# Patient Record
Sex: Female | Born: 1970 | Race: Black or African American | Hispanic: No | Marital: Married | Smoking: Never smoker
Health system: Southern US, Community
[De-identification: ages and names within clinical notes are randomized; demographics above are authoritative.]

## PROBLEM LIST (undated history)

## (undated) HISTORY — PX: BREAST BIOPSY: SHX20

---

## 1988-09-13 HISTORY — PX: OVARIAN CYST SURGERY: SHX726

## 2007-05-20 ENCOUNTER — Emergency Department: Payer: Self-pay | Admitting: Unknown Physician Specialty

## 2009-10-04 ENCOUNTER — Emergency Department: Payer: Self-pay | Admitting: Internal Medicine

## 2009-10-21 ENCOUNTER — Emergency Department: Payer: Self-pay

## 2010-10-14 ENCOUNTER — Ambulatory Visit: Payer: Self-pay

## 2011-03-19 ENCOUNTER — Emergency Department: Payer: Self-pay | Admitting: Emergency Medicine

## 2011-05-08 ENCOUNTER — Emergency Department: Payer: Self-pay | Admitting: Emergency Medicine

## 2011-07-15 ENCOUNTER — Ambulatory Visit: Payer: Self-pay | Admitting: Family Medicine

## 2011-11-06 ENCOUNTER — Emergency Department: Payer: Self-pay | Admitting: Emergency Medicine

## 2012-09-27 ENCOUNTER — Ambulatory Visit: Payer: Self-pay

## 2012-09-28 ENCOUNTER — Ambulatory Visit: Payer: Self-pay

## 2012-12-14 ENCOUNTER — Ambulatory Visit: Payer: Self-pay | Admitting: General Surgery

## 2013-01-10 ENCOUNTER — Encounter: Payer: Self-pay | Admitting: *Deleted

## 2013-07-26 ENCOUNTER — Encounter: Payer: Self-pay | Admitting: General Surgery

## 2013-07-26 ENCOUNTER — Ambulatory Visit (INDEPENDENT_AMBULATORY_CARE_PROVIDER_SITE_OTHER): Payer: BC Managed Care – PPO | Admitting: General Surgery

## 2013-07-26 ENCOUNTER — Other Ambulatory Visit: Payer: BC Managed Care – PPO

## 2013-07-26 VITALS — BP 120/78 | HR 64 | Resp 12 | Ht 59.0 in | Wt 146.0 lb

## 2013-07-26 DIAGNOSIS — N6009 Solitary cyst of unspecified breast: Secondary | ICD-10-CM

## 2013-07-26 DIAGNOSIS — N6001 Solitary cyst of right breast: Secondary | ICD-10-CM

## 2013-07-26 DIAGNOSIS — N63 Unspecified lump in unspecified breast: Secondary | ICD-10-CM

## 2013-07-26 HISTORY — PX: BREAST CYST ASPIRATION: SHX578

## 2013-07-26 NOTE — Patient Instructions (Signed)
Continue self breast exams. Call office for any new breast issues or concerns. 

## 2013-07-26 NOTE — Progress Notes (Signed)
Patient ID: Maria Jacobs, female   DOB: Mar 10, 1971, 42 y.o.   MRN: [id number]  Chief Complaint  Patient presents with  . Follow-up    mammogram    HPI Maria Jacobs is a 42 y.o. female who presents for a breast evaluation. The most recent mammogram was done on 09-27-12. States she can feel a lump in the right breast since her last mammogram.  Denies pain or breast symptoms.  States that Dr Hillery Aldo feels like it has gotten larger and she feels like it gets larger around her monthly cycle. No breast trauma or injury. Patient does perform regular self breast checks and gets regular mammograms done.  No family history of breast cancer.  HPI  No past medical history on file.  Past Surgical History  Procedure Laterality Date  . Ovarian cyst surgery  1990    Family History  Problem Relation Age of Onset  . Adopted: Yes  . Asthma Mother     Social History History  Substance Use Topics  . Smoking status: Never Smoker   . Smokeless tobacco: Never Used  . Alcohol Use: No    No Known Allergies  Current Outpatient Prescriptions  Medication Sig Dispense Refill  . ibuprofen (ADVIL,MOTRIN) 800 MG tablet Take 800 mg by mouth every 8 (eight) hours as needed.       No current facility-administered medications for this visit.    Review of Systems Review of Systems  Constitutional: Negative.   Respiratory: Negative.   Cardiovascular: Negative.     Blood pressure 120/78, pulse 64, resp. rate 12, height 4\' 11"  (1.499 m), weight 146 lb (66.225 kg), last menstrual period 07/14/2013.  Physical Exam Physical Exam  Constitutional: She is oriented to person, place, and time. She appears well-developed and well-nourished.  Neck: Neck supple.  Cardiovascular: Normal rate, regular rhythm and normal heart sounds.   Pulmonary/Chest: Effort normal and breath sounds normal. Right breast exhibits mass (2 cm smooth movable nodule at 10 o'clock). Right breast exhibits no inverted nipple, no nipple  discharge, no skin change and no tenderness. Left breast exhibits no inverted nipple, no mass, no nipple discharge, no skin change and no tenderness.  Lymphadenopathy:    She has no cervical adenopathy.    She has no axillary adenopathy.  Neurological: She is alert and oriented to person, place, and time.  Skin: Skin is warm and dry.    Data Reviewed Right breast ultrasound dated September 28, 2012 showed a 2.5 cm simple cyst in the 9:00 position. BI-RAD-2.  Mammogram dated September 27, 2012 showed extremely dense breast consistent with age. Rounded mass in the lateral aspect. BI-RAD-2.  PCP note reviewed.  Ultrasound examination of the right breast at the 10:00 position showed a 1.1 x 1.4 x 1.5 cm simple cyst with a fairly thick rim. This measured up to 0.2 cm in thickness. This is significantly changed from her January 2014 exam for a thin rim was identified. This is suggestive of chronic inflammation.  The patient was desirous of aspiration due to local discomfort. 1 cc of 1% plain Xylocaine was utilized a well-tolerated. The ureter was aspirated and relatively thin pale green fluid returned through this was discarded. The cystic component completely collapsed. The residual cyst wall was still evident measuring up to 0.6 x 1.1 cm in aggregate.  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      Plan    The patient was advised of  the cyst may recur, and if necessary vacuum excision of the cyst wall the undertaken. We will plan for follow up exam in 6 weeks.         Maria Jacobs 07/27/2013, 6:13 AM

## 2013-07-27 DIAGNOSIS — N63 Unspecified lump in unspecified breast: Secondary | ICD-10-CM | POA: Insufficient documentation

## 2013-07-27 DIAGNOSIS — N6009 Solitary cyst of unspecified breast: Secondary | ICD-10-CM | POA: Insufficient documentation

## 2013-08-07 ENCOUNTER — Encounter: Payer: Self-pay | Admitting: General Surgery

## 2013-09-10 ENCOUNTER — Ambulatory Visit: Payer: BC Managed Care – PPO | Admitting: General Surgery

## 2013-09-12 ENCOUNTER — Ambulatory Visit (INDEPENDENT_AMBULATORY_CARE_PROVIDER_SITE_OTHER): Payer: BC Managed Care – PPO | Admitting: General Surgery

## 2013-09-12 ENCOUNTER — Other Ambulatory Visit: Payer: BC Managed Care – PPO

## 2013-09-12 ENCOUNTER — Encounter: Payer: Self-pay | Admitting: General Surgery

## 2013-09-12 VITALS — BP 140/76 | HR 74 | Resp 12 | Ht 59.0 in | Wt 147.0 lb

## 2013-09-12 DIAGNOSIS — N6009 Solitary cyst of unspecified breast: Secondary | ICD-10-CM

## 2013-09-12 NOTE — Progress Notes (Signed)
Patient ID: Maria Jacobs, female   DOB: [birth year], 42 y.o.   MRN: [id number]  Chief Complaint  Patient presents with  . Follow-up    HPI Maria Jacobs is a 42 y.o. female.  who presents for her 6 week follow up breast evaluation. Patient does perform regular self breast checks. The patient has noted slight fullness in the upper outer quadrant of the right breast over the last 2 weeks, but has not experienced recurrent pain as on her last visit.  HPI  No past medical history on file.  Past Surgical History  Procedure Laterality Date  . Ovarian cyst surgery  1990  . Breast cyst aspiration Right 07-26-13    Family History  Problem Relation Age of Onset  . Adopted: Yes  . Asthma Mother     Social History History  Substance Use Topics  . Smoking status: Never Smoker   . Smokeless tobacco: Never Used  . Alcohol Use: No    No Known Allergies  Current Outpatient Prescriptions  Medication Sig Dispense Refill  . ibuprofen (ADVIL,MOTRIN) 800 MG tablet Take 800 mg by mouth every 8 (eight) hours as needed.       No current facility-administered medications for this visit.    Review of Systems Review of Systems  Constitutional: Negative.   Respiratory: Negative.   Cardiovascular: Negative.     Blood pressure 140/76, pulse 74, resp. rate 12, height 4\' 11"  (1.499 m), weight 147 lb (66.679 kg), last menstrual period 08/13/2013.  Physical Exam Physical Exam  Constitutional: She is oriented to person, place, and time. She appears well-developed and well-nourished.  Neck: Neck supple.  Cardiovascular: Normal rate, regular rhythm and normal heart sounds.   Pulmonary/Chest: Effort normal and breath sounds normal. Right breast exhibits no inverted nipple, no mass, no nipple discharge, no skin change and no tenderness.    Right breast > left breast  Lymphadenopathy:    She has no cervical adenopathy.    She has no axillary adenopathy.  Neurological: She is alert and oriented to  person, place, and time.  Skin: Skin is warm and dry.    Data Reviewed Ultrasound examination of the upper outer quadrant right breast showed a recurrent cyst measuring 1.3 x 1.7 x 1.9 cm. The thickened wall appreciated on her last visit is much less evident. The area was aspirated with return of less than 3 cc of turbid fluid with complete collapse. This fluid was sent for cytology due to the cyst recurrence and the thickened wall previously evident. The cavity was then treated with air insufflation. The procedure was well-tolerated with the use of 1 cc of 1% plain Xylocaine.  Assessment    Recurrent right breast cyst status post re-aspiration and air insufflation.     Plan    The patient will be contacted when her cytology report is available.        Earline Mayotte 09/14/2013, 3:30 PM

## 2013-09-12 NOTE — Patient Instructions (Signed)
Continue self breast exams. Call office for any new breast issues or concerns. 

## 2013-09-17 LAB — FINE-NEEDLE ASPIRATION

## 2014-01-01 ENCOUNTER — Ambulatory Visit: Payer: Self-pay | Admitting: Family Medicine

## 2014-03-04 ENCOUNTER — Ambulatory Visit: Payer: BC Managed Care – PPO | Admitting: General Surgery

## 2014-03-25 ENCOUNTER — Ambulatory Visit: Payer: BC Managed Care – PPO | Admitting: General Surgery

## 2014-03-28 ENCOUNTER — Encounter: Payer: Self-pay | Admitting: *Deleted

## 2014-07-15 ENCOUNTER — Encounter: Payer: Self-pay | Admitting: General Surgery

## 2014-12-05 ENCOUNTER — Emergency Department: Payer: Self-pay | Admitting: Emergency Medicine

## 2014-12-05 LAB — COMPREHENSIVE METABOLIC PANEL
AST: 23 U/L
Albumin: 4.2 g/dL
Alkaline Phosphatase: 59 U/L
Anion Gap: 9 (ref 7–16)
BILIRUBIN TOTAL: 0.3 mg/dL
BUN: 14 mg/dL
CHLORIDE: 106 mmol/L
Calcium, Total: 8.9 mg/dL
Co2: 25 mmol/L
Creatinine: 0.79 mg/dL
GLUCOSE: 131 mg/dL — AB
Potassium: 3.6 mmol/L
SGPT (ALT): 22 U/L
SODIUM: 140 mmol/L
TOTAL PROTEIN: 7.6 g/dL

## 2014-12-05 LAB — CBC
HCT: 36.8 % (ref 35.0–47.0)
HGB: 11.8 g/dL — ABNORMAL LOW (ref 12.0–16.0)
MCH: 30 pg (ref 26.0–34.0)
MCHC: 32.1 g/dL (ref 32.0–36.0)
MCV: 93 fL (ref 80–100)
PLATELETS: 386 10*3/uL (ref 150–440)
RBC: 3.94 10*6/uL (ref 3.80–5.20)
RDW: 13.8 % (ref 11.5–14.5)
WBC: 14.4 10*3/uL — ABNORMAL HIGH (ref 3.6–11.0)

## 2014-12-05 LAB — URINALYSIS, COMPLETE
Bilirubin,UR: NEGATIVE
GLUCOSE, UR: NEGATIVE mg/dL (ref 0–75)
Ketone: NEGATIVE
Nitrite: NEGATIVE
Ph: 6 (ref 4.5–8.0)
Protein: 30
RBC,UR: 198 /HPF (ref 0–5)
Specific Gravity: 1.021 (ref 1.003–1.030)
Squamous Epithelial: 2
WBC UR: 2 /HPF (ref 0–5)

## 2014-12-05 LAB — LIPASE, BLOOD: LIPASE: 26 U/L

## 2015-01-09 ENCOUNTER — Ambulatory Visit
Admit: 2015-01-09 | Disposition: A | Discharge status: Home / Self Care | Payer: Self-pay | Attending: Family Medicine | Admitting: Family Medicine

## 2015-02-19 ENCOUNTER — Encounter: Payer: Self-pay | Admitting: General Surgery

## 2015-02-19 ENCOUNTER — Ambulatory Visit (INDEPENDENT_AMBULATORY_CARE_PROVIDER_SITE_OTHER): Payer: BLUE CROSS/BLUE SHIELD | Admitting: General Surgery

## 2015-02-19 VITALS — BP 128/82 | HR 68 | Resp 14 | Ht 59.0 in | Wt 158.0 lb

## 2015-02-19 DIAGNOSIS — N6002 Solitary cyst of left breast: Secondary | ICD-10-CM

## 2015-02-19 DIAGNOSIS — N6001 Solitary cyst of right breast: Secondary | ICD-10-CM | POA: Diagnosis not present

## 2015-02-19 NOTE — Patient Instructions (Signed)
Continue self breast exams. Call office for any new breast issues or concerns. 

## 2015-02-19 NOTE — Progress Notes (Signed)
Patient ID: Maria Jacobs, female   DOB: [birth year], 44 y.o.   MRN: [id number]  Chief Complaint  Patient presents with  . Follow-up    breast cyst    HPI Maria Jacobs is a 44 y.o. female.  who presents for a breast evaluation. The most recent mammogram and ultrasound was done on 01-09-15.  Patient does perform regular self breast checks and gets regular mammograms done.  She states she has tenderness in both breast from the cyst and she would like the cyst removed.  Denies breast injury or trauma. Denies nipple discharge. Denies family history of breast cancer that she is aware of since she was adopted.Maria Jacobs  HPI  No past medical history on file.  Past Surgical History  Procedure Laterality Date  . Ovarian cyst surgery  1990  . Breast cyst aspiration Right 07-26-13    Family History  Problem Relation Age of Onset  . Adopted: Yes  . Asthma Mother     Social History History  Substance Use Topics  . Smoking status: Never Smoker   . Smokeless tobacco: Never Used  . Alcohol Use: No    No Known Allergies  Current Outpatient Prescriptions  Medication Sig Dispense Refill  . ibuprofen (ADVIL,MOTRIN) 800 MG tablet Take 800 mg by mouth every 8 (eight) hours as needed.     No current facility-administered medications for this visit.    Review of Systems Review of Systems  Constitutional: Negative.   Respiratory: Negative.   Cardiovascular: Negative.     Blood pressure 128/82, pulse 68, resp. rate 14, height 4\' 11"  (1.499 m), weight 158 lb (71.668 kg), last menstrual period 02/18/2015.  Physical Exam Physical Exam  Constitutional: She is oriented to person, place, and time. She appears well-developed and well-nourished.  Neck: Neck supple.  Cardiovascular: Normal rate, regular rhythm and normal heart sounds.   Pulmonary/Chest: Effort normal and breath sounds normal. Right breast exhibits tenderness. Right breast exhibits no inverted nipple, no mass, no nipple discharge and no  skin change. Left breast exhibits tenderness. Left breast exhibits no inverted nipple, no mass, no nipple discharge and no skin change.  Thickening at 2 o'clock 4 CFN left breast. Thickening 10 o'clock 7 CFN right breast.  Lymphadenopathy:    She has no cervical adenopathy.    She has no axillary adenopathy.  Neurological: She is alert and oriented to person, place, and time.  Skin: Skin is warm and dry.    Data Reviewed Right breast ultrasound dated 01/09/2015 showed a 2 cm cyst in the 11:00 position. Left breast ultrasound the same date showed a 3 cm cyst at the 12:00 position.  Assessment    Symptomatic breast cyst. Right breast cyst status post recurrence 2 after aspiration.    Plan    Vacuum excision was recommended as the patient is symptomatic with the cyst. This is preferable to formal surgical excision and has a high likelihood of success.    Bilateral encore    PCP:  Delos Haring 02/21/2015, 2:15 PM

## 2015-02-21 DIAGNOSIS — N6001 Solitary cyst of right breast: Secondary | ICD-10-CM | POA: Insufficient documentation

## 2015-02-21 DIAGNOSIS — N6002 Solitary cyst of left breast: Principal | ICD-10-CM

## 2015-03-05 ENCOUNTER — Other Ambulatory Visit: Payer: BLUE CROSS/BLUE SHIELD

## 2015-03-05 ENCOUNTER — Encounter: Payer: Self-pay | Admitting: General Surgery

## 2015-03-05 ENCOUNTER — Ambulatory Visit (INDEPENDENT_AMBULATORY_CARE_PROVIDER_SITE_OTHER): Payer: BLUE CROSS/BLUE SHIELD | Admitting: General Surgery

## 2015-03-05 VITALS — BP 110/64 | HR 80 | Resp 13 | Ht 59.0 in | Wt 158.0 lb

## 2015-03-05 DIAGNOSIS — N6001 Solitary cyst of right breast: Secondary | ICD-10-CM

## 2015-03-05 DIAGNOSIS — N6002 Solitary cyst of left breast: Secondary | ICD-10-CM

## 2015-03-05 HISTORY — PX: BREAST CYST ASPIRATION: SHX578

## 2015-03-05 NOTE — Patient Instructions (Addendum)
CARE AFTER BREAST BIOPSY  1. Leave the dressing on that your doctor applied after surgery. It is waterproof. You may bathe, shower and/or swim. The dressing will probably remain intact until your return office visit. If the dressing comes off, you will see small strips of tape against your skin on the incision. Do not remove these strips.  2. You may want to use a gauze,cloth or similar protection in your bra to prevent rubbing against your dressing and incision. This is not necessary, but you may feel more comfortable doing so.  3. It is recommended that you wear a bra day and night to give support to the breast. This will prevent the weight of the breast from pulling on the incision.  4. Your breast will feel hard and lumpy under the incision. Do not be alarmed. This is the underlying stitching of tissue. Softening of this tissue will occur in time.  5. Make sure you call the office and schedule an appointment in one week after your surgery. The office phone number is [phone]. The [facility] may have already done this for you.  6. You will notice about a week after your office visit that the strips of the tape on your incision will begin to loosen. These may then be removed.  7. Report to your doctor any of the following:  * Severe pain not relieved by your pain medication  *Redness of the incision  * Drainage from the incision  *Fever greater than 101 degrees   Patient to follow up for a nurse check in one week.   Will plan for the patient to return to the office in one month for physician follow up.

## 2015-03-05 NOTE — Progress Notes (Signed)
Patient ID: Maria Jacobs, female   DOB: [birth year], 44 y.o.   MRN: [id number]  Chief Complaint  Patient presents with  . Procedure    biopsy right and left breast    HPI Maria Jacobs is a 44 y.o. female here for bilateral breast vacuum biopsies. The patient has had a recurring cyst in the right breast which is failed to respond to aspiration and air insufflation as well as a new cyst on the left breast. Vacuum excision with removal of the cyst wall had been recommended as opposed to formal excision. HPI  No past medical history on file.  Past Surgical History  Procedure Laterality Date  . Ovarian cyst surgery  1990  . Breast cyst aspiration Right 07-26-13    Family History  Problem Relation Age of Onset  . Adopted: Yes  . Asthma Mother     Social History History  Substance Use Topics  . Smoking status: Never Smoker   . Smokeless tobacco: Never Used  . Alcohol Use: No    No Known Allergies  Current Outpatient Prescriptions  Medication Sig Dispense Refill  . ibuprofen (ADVIL,MOTRIN) 800 MG tablet Take 800 mg by mouth every 8 (eight) hours as needed.     No current facility-administered medications for this visit.    Review of Systems Review of Systems  Constitutional: Negative.   Respiratory: Negative.   Cardiovascular: Negative.     Blood pressure 110/64, pulse 80, resp. rate 13, height 4\' 11"  (1.499 m), weight 158 lb (71.668 kg), last menstrual period 02/18/2015.  Physical Exam Physical Exam  Constitutional: She is oriented to person, place, and time. She appears well-developed and well-nourished.  Pulmonary/Chest:    Neurological: She is alert and oriented to person, place, and time.  Skin: Skin is warm and dry.    Data Reviewed The procedure was reviewed in detail. 10 mL of 0.5% Xylocaine with 0.25% Marcaine with 1-200,000 units of epinephrine was used on each side.  A 10-gauge Encor device was passed into the right breast lesion at the 10:00 position 3  cm from the nipple under ultrasound guidance after chlor prep skin cleansing. The cyst rapidly decompressed. 12 samples were obtained to obliterate the cyst wall. A postbiopsy clip was placed. No discomfort during the procedure. Skin defect closed with benzoin and Steri-Strips followed by Telfa and Tegaderm dressing.  The left breast lesion at the 1:00 position, 3 cm from the nipple was treated in a similar fashion. Prompt decompression with passage of the 10-gauge Encor device was noted. The patient did experience pain during the procedure and 10 core samples were obtained. It was evident that a blood vessel had been dilated as a hematoma developed. This was aspirated and direct pressure with the ultrasound probe held. As biopsy clip placed. Skin defect was closed as noted above for the right breast. At the end of the procedure the patient was symptom-free.  Assessment    Bilateral breast cysts.    Plan    Patient to follow up for a nurse check in one week. The patient will be contacted when pathology results are available.  Will plan for the patient to return to the office in one month for physician follow up.     PCP: Delos Haring 03/06/2015, 7:34 PM

## 2015-03-11 ENCOUNTER — Ambulatory Visit: Payer: BLUE CROSS/BLUE SHIELD

## 2015-03-13 ENCOUNTER — Ambulatory Visit (INDEPENDENT_AMBULATORY_CARE_PROVIDER_SITE_OTHER): Payer: BLUE CROSS/BLUE SHIELD | Admitting: *Deleted

## 2015-03-13 DIAGNOSIS — N6002 Solitary cyst of left breast: Secondary | ICD-10-CM

## 2015-03-13 DIAGNOSIS — N6001 Solitary cyst of right breast: Secondary | ICD-10-CM

## 2015-03-13 NOTE — Patient Instructions (Signed)
As scheduled

## 2015-03-13 NOTE — Progress Notes (Signed)
Patient came in today for a wound check bilateral breast biopsy.  The wounds are clean, with no signs of infection noted. Follow up as scheduled.

## 2015-04-03 ENCOUNTER — Ambulatory Visit: Payer: BLUE CROSS/BLUE SHIELD | Admitting: General Surgery

## 2015-06-11 ENCOUNTER — Ambulatory Visit: Payer: BLUE CROSS/BLUE SHIELD | Admitting: General Surgery

## 2015-07-02 ENCOUNTER — Ambulatory Visit
Admission: RE | Admit: 2015-07-02 | Discharge: 2015-07-02 | Disposition: A | Discharge status: Home / Self Care | Payer: BLUE CROSS/BLUE SHIELD | Source: Ambulatory Visit | Attending: Family Medicine | Admitting: Family Medicine

## 2015-07-02 ENCOUNTER — Other Ambulatory Visit: Payer: Self-pay | Admitting: Family Medicine

## 2015-07-02 DIAGNOSIS — M5441 Lumbago with sciatica, right side: Secondary | ICD-10-CM

## 2015-07-18 ENCOUNTER — Other Ambulatory Visit: Payer: Self-pay | Admitting: Family Medicine

## 2015-07-18 DIAGNOSIS — N6001 Solitary cyst of right breast: Secondary | ICD-10-CM

## 2015-07-21 ENCOUNTER — Ambulatory Visit: Payer: BLUE CROSS/BLUE SHIELD | Attending: Family Medicine | Admitting: Physical Therapy

## 2015-07-25 ENCOUNTER — Ambulatory Visit: Payer: BLUE CROSS/BLUE SHIELD | Admitting: Physical Therapy

## 2015-08-01 ENCOUNTER — Encounter: Payer: BLUE CROSS/BLUE SHIELD | Admitting: Physical Therapy

## 2015-08-01 ENCOUNTER — Ambulatory Visit
Admission: RE | Admit: 2015-08-01 | Discharge: 2015-08-01 | Disposition: A | Discharge status: Home / Self Care | Payer: BLUE CROSS/BLUE SHIELD | Source: Ambulatory Visit | Attending: Family Medicine | Admitting: Family Medicine

## 2015-08-01 DIAGNOSIS — N6001 Solitary cyst of right breast: Secondary | ICD-10-CM | POA: Insufficient documentation

## 2015-08-13 ENCOUNTER — Encounter: Payer: Self-pay | Admitting: *Deleted

## 2016-06-25 IMAGING — CT CT ABD-PELV W/ CM
2 of 5 series · 16 of 46 positions shown, 18 images · IV contrast (omnipaque)
Comparison: Pelvis ultrasound 4161 hours today.

CLINICAL DATA: 43-year-old female with right lower quadrant pain
increasing since yesterday. Vomiting. Initial encounter.

EXAM:
CT ABDOMEN AND PELVIS WITH CONTRAST
TECHNIQUE: Multidetector CT imaging of the abdomen and pelvis was performed
using the standard protocol following bolus administration of
intravenous contrast.
CONTRAST:  100 mL Omnipaque 300.

[Series 2: routine abd pel with · axial · 0.74mm/px · z∈[-252,+152]mm · 13 of 91 slices shown, 15 images]
[im 5/91  soft-tissue]
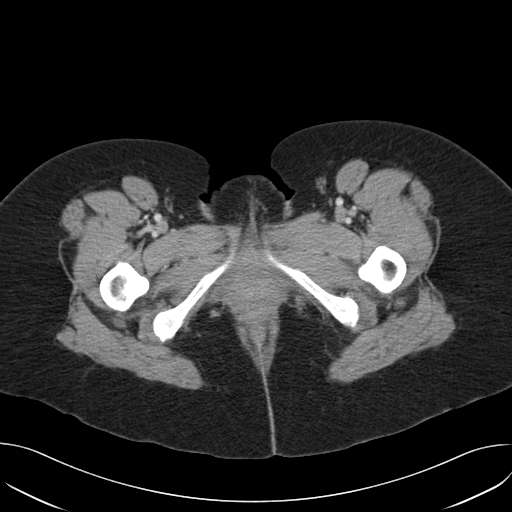
[im 5/91  bone]
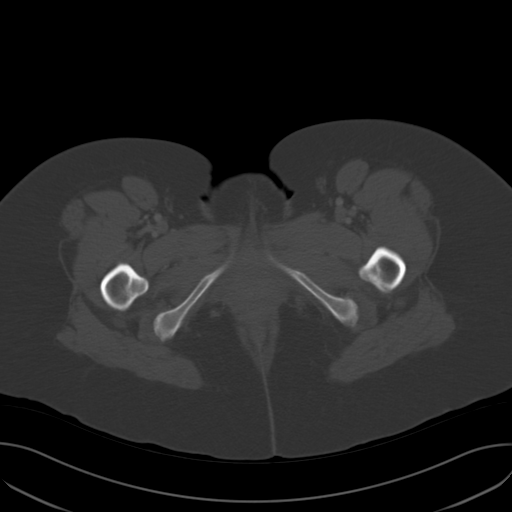
[im 14/91  soft-tissue]
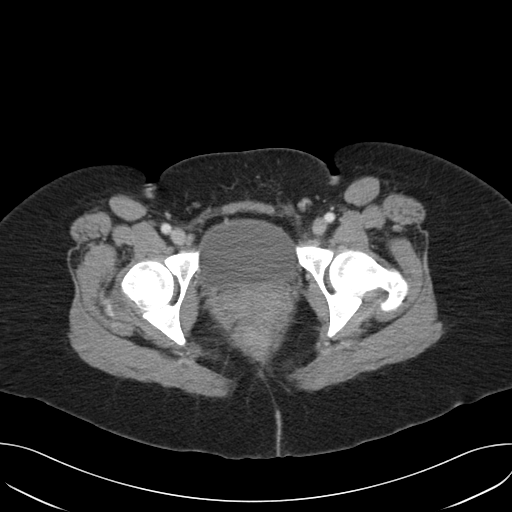
[im 19/91  soft-tissue]
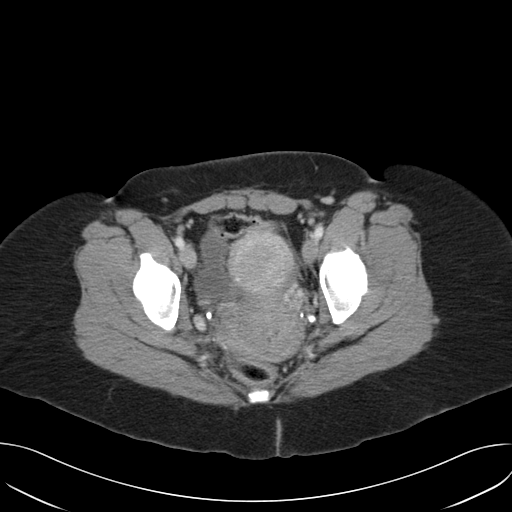
[im 28/91  soft-tissue]
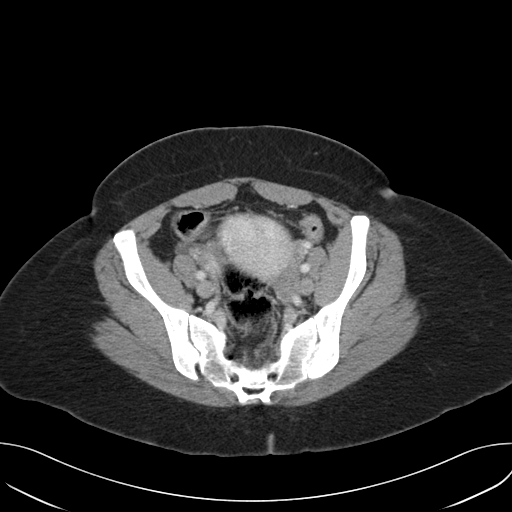
[im 32/91  soft-tissue]
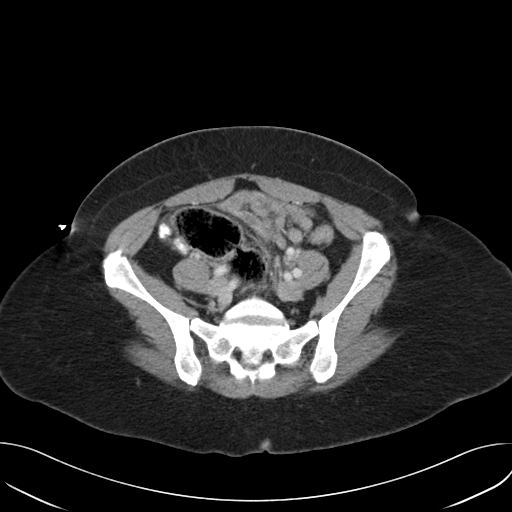
[im 41/91  soft-tissue]
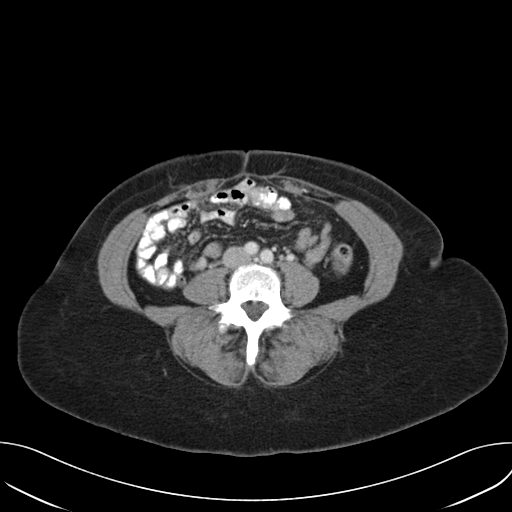
[im 46/91  soft-tissue]
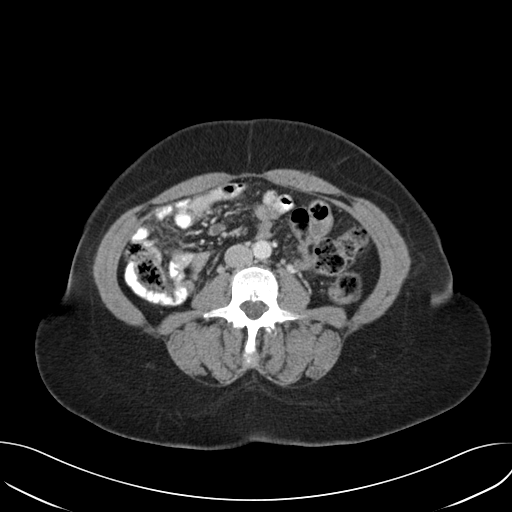
[im 50/91  soft-tissue]
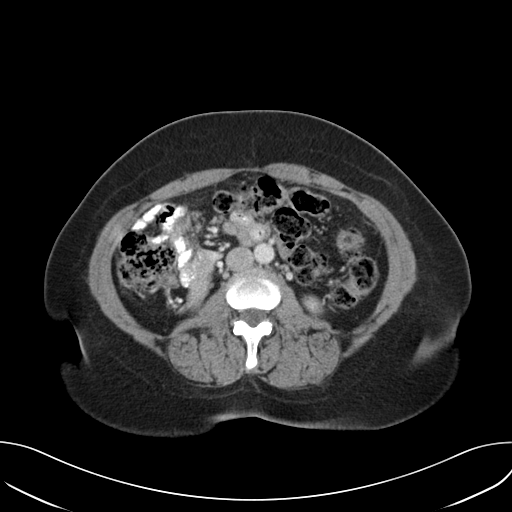
[im 59/91  soft-tissue]
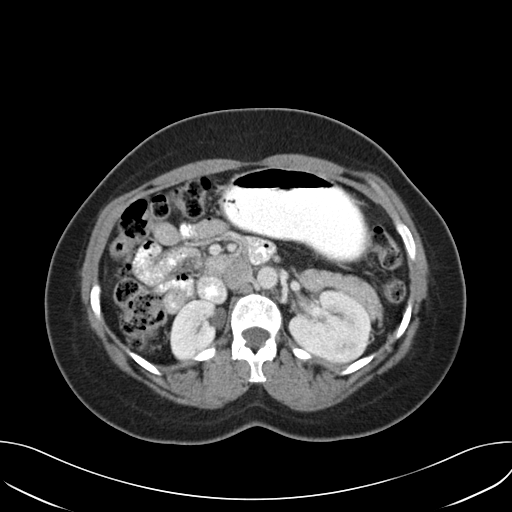
[im 59/91  bone]
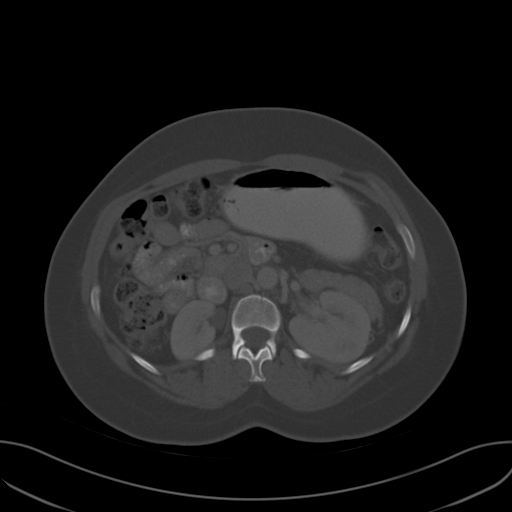
[im 64/91  soft-tissue]
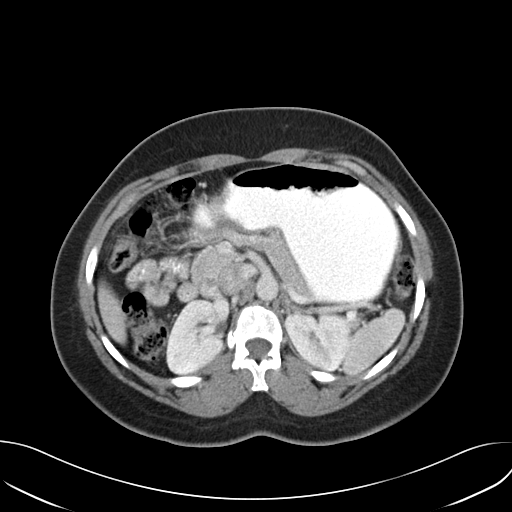
[im 73/91  soft-tissue]
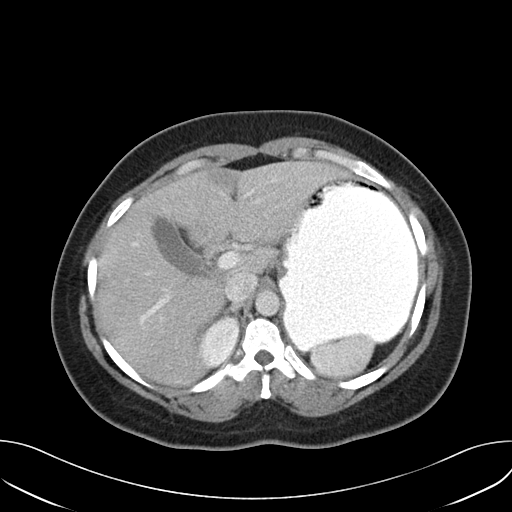
[im 77/91  soft-tissue]
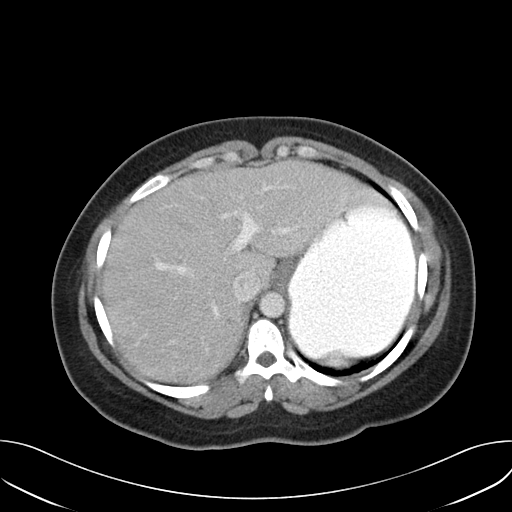
[im 86/91  soft-tissue]
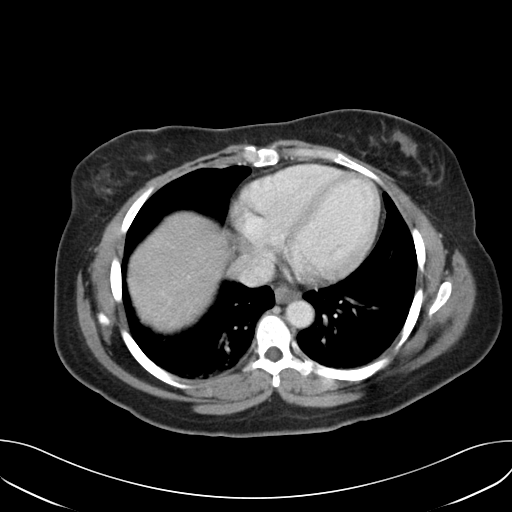

[Series 6: cor routine abd pel with · coronal · 0.73mm/px · 3 of 121 slices shown]
[im 41/121  soft-tissue]
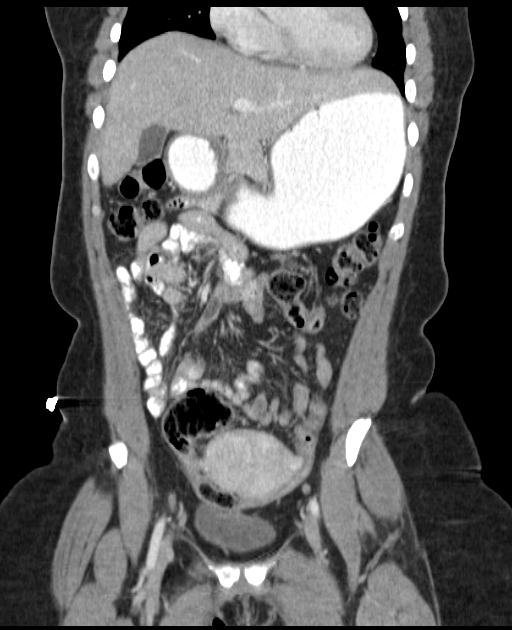
[im 54/121  soft-tissue]
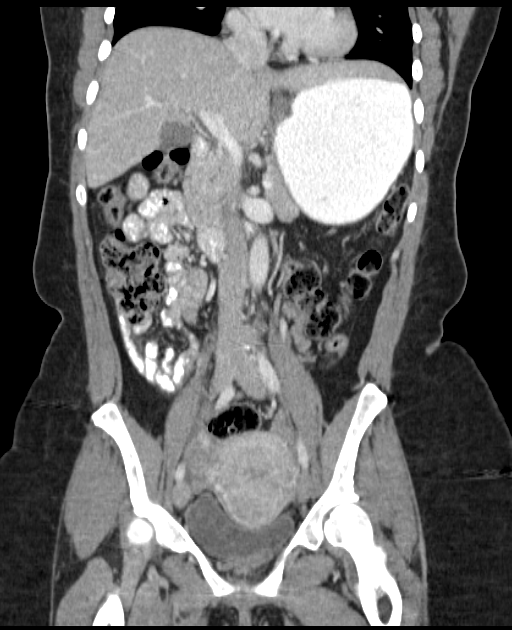
[im 67/121  soft-tissue]
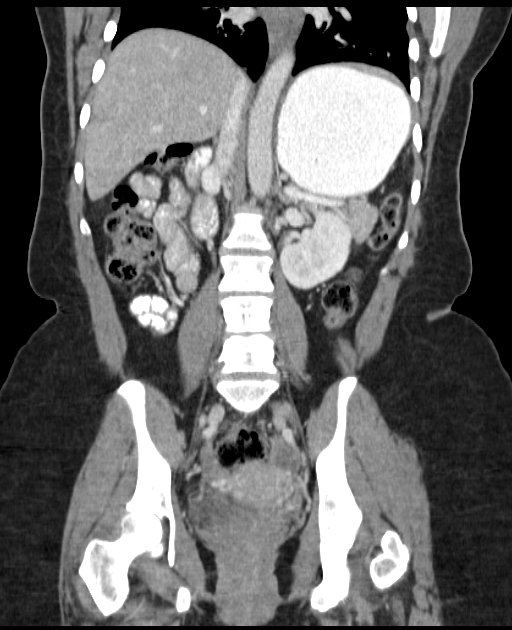

[16 of 46 positions shown; findings below may reference images not displayed]

FINDINGS: Bilateral lung base atelectasis. Mild cardiomegaly. No pericardial
or pleural effusion.

No acute osseous abnormality identified. Lower lumbar facet
degeneration.

Trace if any pelvic free fluid. Decompressed distal colon.
Unremarkable bladder. Mildly enlarged uterus with ventral 3-4 cm
uterine fibroid (sagittal image 91). At adnexa within normal limits.

Redundant but otherwise negative sigmoid colon. Negative left colon.
Redundant transverse colon and hepatic flexure with some retained
stool. Negative right colon and appendix. Oral contrast has almost
reached the terminal ileum which appears normal. No dilated or
inflamed small bowel loops identified. The stomach is moderately
distended with contrast. Negative duodenum.

Liver, gallbladder, spleen, pancreas, adrenal glands, and portal
venous system are within normal limits. Major arterial structures
are patent. No atherosclerosis identified. No abdominal free fluid.
Bilateral renal enhancement and contrast excretion are within normal
limits. No hydroureter. Multiple pelvic phleboliths. No
lymphadenopathy.
IMPRESSION: Normal appendix. No acute or inflammatory findings identified in the
abdomen or pelvis.

## 2017-09-05 ENCOUNTER — Encounter: Payer: Self-pay | Admitting: Emergency Medicine

## 2017-09-05 ENCOUNTER — Other Ambulatory Visit: Payer: Self-pay

## 2017-09-05 ENCOUNTER — Emergency Department
Admission: EM | Admit: 2017-09-05 | Discharge: 2017-09-05 | Disposition: A | Discharge status: Home / Self Care | Payer: BLUE CROSS/BLUE SHIELD | Attending: Emergency Medicine | Admitting: Emergency Medicine

## 2017-09-05 DIAGNOSIS — H1031 Unspecified acute conjunctivitis, right eye: Secondary | ICD-10-CM | POA: Diagnosis not present

## 2017-09-05 DIAGNOSIS — H5789 Other specified disorders of eye and adnexa: Secondary | ICD-10-CM | POA: Diagnosis present

## 2017-09-05 MED ORDER — TOBRAMYCIN 0.3 % OP SOLN: 2.0000 [drp] | OPHTHALMIC | 0 refills | Status: DC

## 2017-09-05 NOTE — ED Triage Notes (Signed)
Pt c/o irritation to both eyes right longer than left. Drainage only when outside per pt. Sclera red on right eye.  Ambulatory without distress.

## 2017-09-05 NOTE — ED Notes (Signed)
See triage note  Presents with redness and irritation to left eye for couple of days  Denies any drainage or vision changes

## 2017-09-05 NOTE — Discharge Instructions (Signed)
Began using antibiotic eye drops as directed every 4 hours hours while awake. Follow-up with Dr. George Ina if any continued problems with your eye or not improving.

## 2017-09-05 NOTE — ED Provider Notes (Signed)
[facility] Emergency Department Provider Note  ____________________________________________   First MD Initiated Contact with Patient 09/05/17 [phone]     (approximate)  I have reviewed the triage vital signs and the nursing notes.   HISTORY  Chief Complaint Eye Problem   HPI Maria Jacobs is a 46 y.o. female is here complaining of right eye irritation and drainage for the last several days. 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patient woke up with lashes matted shut. She denies any visual changes or sensation of foreign body.   History reviewed. No pertinent past medical history.  Patient Active Problem List   Diagnosis Date Noted  . Bilateral breast cysts 02/21/2015    Past Surgical History:  Procedure Laterality Date  . BREAST CYST ASPIRATION Right 07-26-13  . BREAST CYST ASPIRATION Bilateral 03/05/2015  . OVARIAN CYST SURGERY  1990    Prior to Admission medications   Medication Sig Start Date End Date Taking? Authorizing Provider  tobramycin (TOBREX) 0.3 % ophthalmic solution Place 2 drops into the right eye every 4 (four) hours. While awake 09/05/17   Johnn Hai, PA-C    Allergies Patient has no known allergies.  Family History  Adopted: Yes  Problem Relation Age of Onset  . Asthma Mother     Social History Social History   Tobacco Use  . Smoking status: Never Smoker  . Smokeless tobacco: Never Used  Substance Use Topics  . Alcohol use: No  . Drug use: No    Review of Systems Constitutional: No fever/chills Eyes: positive right eye  drainage Cardiovascular: Denies chest pain. Respiratory: Denies shortness of breath. Gastrointestinal:   No nausea, no vomiting.  Skin: Negative for rash. Neurological: Negative for headaches  ____________________________________________   PHYSICAL EXAM:  VITAL SIGNS: ED Triage Vitals  Enc Vitals Group     BP 09/05/17 0747 128/82     Pulse Rate 09/05/17  0747 80     Resp 09/05/17 0747 18     Temp 09/05/17 0747 99.3 F (37.4 C)     Temp Source 09/05/17 0747 Oral     SpO2 09/05/17 0747 100 %     Weight 09/05/17 0745 158 lb (71.7 kg)     Height 09/05/17 0745 4\' 11"  (1.499 m)     Head Circumference --      Peak Flow --      Pain Score --      Pain Loc --      Pain Edu? --      Excl. in [place]? --    Constitutional: Alert and oriented. Well appearing and in no acute distress. Eyes: left conjunctiva is moderately injected with yellow exudate present. Right conjunctiva is clear at this time. No foreign body noted. PERRL. EOMI. Head: Atraumatic. Nose: No congestion/rhinnorhea. Neck: No stridor.   Cardiovascular: Normal rate, regular rhythm. Grossly normal heart sounds.  Good peripheral circulation. Respiratory: Normal respiratory effort.  No retractions. Lungs CTAB. Musculoskeletal: moves upper and lower extremities that difficulty. Normal gait was noted. Neurologic:  Normal speech and language. No gross focal neurologic deficits are appreciated. No gait instability. Skin:  Skin is warm, dry and intact. No rash noted. Psychiatric: Mood and affect are normal. Speech and behavior are normal.  ____________________________________________   LABS (all labs ordered are listed, but only abnormal results are displayed)  Labs Reviewed - No data to display   PROCEDURES  Procedure(s) performed: None  Procedures  Critical Care performed: No  ____________________________________________   INITIAL IMPRESSION / ASSESSMENT AND PLAN / ED COURSE Patient is placed on Tobrex ophthalmic solution2 drops every 4 hours while awake. Patient does not work again until 12/26 at that time she should be clear.   She is to follow-up with Dr.Porfilio if any continued problems. ____________________________________________   FINAL CLINICAL IMPRESSION(S) / ED DIAGNOSES  Final diagnoses:  Acute bacterial conjunctivitis of right eye     ED Discharge Orders         Ordered    tobramycin (TOBREX) 0.3 % ophthalmic solution  Every 4 hours     09/05/17 0810       Note:  This document was prepared using Dragon voice recognition software and may include unintentional dictation errors.    Johnn Hai, PA-C 09/05/17 8676    Orbie Pyo, MD 09/05/17 [phone]

## 2018-09-14 ENCOUNTER — Telehealth: Payer: Self-pay

## 2018-09-14 NOTE — Telephone Encounter (Signed)
Pt is calling fore Maria Jacobs to schedule a colonoscopy

## 2018-09-15 NOTE — Telephone Encounter (Signed)
LVM for pt returning her call to schedule colonoscopy.  Thanks [employer]

## 2018-09-18 ENCOUNTER — Telehealth: Payer: Self-pay

## 2018-09-18 ENCOUNTER — Other Ambulatory Visit: Payer: Self-pay | Admitting: Family Medicine

## 2018-09-18 DIAGNOSIS — Z1231 Encounter for screening mammogram for malignant neoplasm of breast: Secondary | ICD-10-CM

## 2018-09-18 NOTE — Telephone Encounter (Signed)
Pt left vm to schedule a colonoscopy   °

## 2018-09-19 ENCOUNTER — Other Ambulatory Visit: Payer: Self-pay

## 2018-09-19 DIAGNOSIS — Z1211 Encounter for screening for malignant neoplasm of colon: Secondary | ICD-10-CM

## 2018-09-19 NOTE — Telephone Encounter (Signed)
Returned patients call.  She has been scheduled for her colonoscopy with Dr. Marius Ditch on 10/02/18 at [facility].  Thanks [employer]

## 2018-10-02 ENCOUNTER — Ambulatory Visit: Payer: BLUE CROSS/BLUE SHIELD | Admitting: Anesthesiology

## 2018-10-02 ENCOUNTER — Encounter: Payer: Self-pay | Admitting: Anesthesiology

## 2018-10-02 ENCOUNTER — Encounter
Admission: RE | Disposition: A | Discharge status: Home / Self Care | Payer: Self-pay | Source: Home / Self Care | Attending: Gastroenterology

## 2018-10-02 ENCOUNTER — Ambulatory Visit
Admission: RE | Admit: 2018-10-02 | Discharge: 2018-10-02 | Disposition: A | Discharge status: Home / Self Care | Payer: BLUE CROSS/BLUE SHIELD | Attending: Gastroenterology | Admitting: Gastroenterology

## 2018-10-02 DIAGNOSIS — D120 Benign neoplasm of cecum: Secondary | ICD-10-CM | POA: Insufficient documentation

## 2018-10-02 DIAGNOSIS — Z791 Long term (current) use of non-steroidal anti-inflammatories (NSAID): Secondary | ICD-10-CM | POA: Diagnosis not present

## 2018-10-02 DIAGNOSIS — Z1211 Encounter for screening for malignant neoplasm of colon: Secondary | ICD-10-CM | POA: Diagnosis present

## 2018-10-02 DIAGNOSIS — K644 Residual hemorrhoidal skin tags: Secondary | ICD-10-CM | POA: Diagnosis not present

## 2018-10-02 HISTORY — PX: COLONOSCOPY WITH PROPOFOL: SHX5780

## 2018-10-02 LAB — POCT PREGNANCY, URINE: Preg Test, Ur: NEGATIVE

## 2018-10-02 SURGERY — COLONOSCOPY WITH PROPOFOL
Anesthesia: General

## 2018-10-02 MED ORDER — LIDOCAINE HCL (CARDIAC) PF 100 MG/5ML IV SOSY
PREFILLED_SYRINGE | INTRAVENOUS | Status: DC | PRN
  Administered 2018-10-02: 25 mg via INTRAVENOUS

## 2018-10-02 MED ORDER — PROPOFOL 500 MG/50ML IV EMUL
INTRAVENOUS | Status: DC | PRN
  Administered 2018-10-02: 150 ug/kg/min via INTRAVENOUS

## 2018-10-02 MED ORDER — PROPOFOL 10 MG/ML IV BOLUS
INTRAVENOUS | Status: DC | PRN
  Administered 2018-10-02: 30 mg via INTRAVENOUS
  Administered 2018-10-02: 100 mg via INTRAVENOUS

## 2018-10-02 MED ORDER — SODIUM CHLORIDE 0.9 % IV SOLN
INTRAVENOUS | Status: DC
  Administered 2018-10-02: 11:00:00 via INTRAVENOUS

## 2018-10-02 NOTE — H&P (Signed)
Maria Darby, MD [address]  [place]  [place], [place] [address]  Main: [phone]  Fax: [phone] Pager: [phone]  Primary Care Physician:  Denton Lank, MD Primary Gastroenterologist:  Dr. Cephas Jacobs  Pre-Procedure History & Physical: HPI:  Maria Jacobs is a 48 y.o. female is here for an colonoscopy.   History reviewed. No pertinent past medical history.  Past Surgical History:  Procedure Laterality Date  . BREAST CYST ASPIRATION Right 07-26-13  . BREAST CYST ASPIRATION Bilateral 03/05/2015  . OVARIAN CYST SURGERY  1990    Prior to Admission medications   Medication Sig Start Date End Date Taking? Authorizing Provider  ibuprofen (ADVIL,MOTRIN) 800 MG tablet Take 800 mg by mouth every 8 (eight) hours as needed.   Yes [provider]  tobramycin (TOBREX) 0.3 % ophthalmic solution Place 2 drops into the right eye every 4 (four) hours. While awake Patient not taking: Reported on 10/02/2018 09/05/17   Johnn Hai, PA-C    Allergies as of 09/19/2018  . (No Known Allergies)    Family History  Adopted: Yes  Problem Relation Age of Onset  . Asthma Mother     Social History   Socioeconomic History  . Marital status: Married    Spouse name: Not on file  . Number of children: Not on file  . Years of education: Not on file  . Highest education level: Not on file  Occupational History  . Not on file  Social Needs  . Financial resource strain: Not on file  . Food insecurity:    Worry: Not on file    Inability: Not on file  . Transportation needs:    Medical: Not on file    Non-medical: Not on file  Tobacco Use  . Smoking status: Never Smoker  . Smokeless tobacco: Never Used  Substance and Sexual Activity  . Alcohol use: No  . Drug use: No  . Sexual activity: Not on file  Lifestyle  . Physical activity:    Days per week: Not on file    Minutes per session: Not on file  . Stress: Not on file  Relationships  . Social  connections:    Talks on phone: Not on file    Gets together: Not on file    Attends religious service: Not on file    Active member of club or organization: Not on file    Attends meetings of clubs or organizations: Not on file    Relationship status: Not on file  . Intimate partner violence:    Fear of current or ex partner: Not on file    Emotionally abused: Not on file    Physically abused: Not on file    Forced sexual activity: Not on file  Other Topics Concern  . Not on file  Social History Narrative  . Not on file    Review of Systems: See HPI, otherwise negative ROS  Physical Exam: BP 113/74   Pulse 73   Temp 97.8 F (36.6 C) (Tympanic)   Resp 18   Ht 4\' 11"  (1.499 m)   Wt 65.8 kg   SpO2 100%   BMI 29.29 kg/m  General:   Alert,  pleasant and cooperative in NAD Head:  Normocephalic and atraumatic. Neck:  Supple; no masses or thyromegaly. Lungs:  Clear throughout to auscultation.    Heart:  Regular rate and rhythm. Abdomen:  Soft, nontender and nondistended. Normal bowel sounds, without guarding, and without rebound.  Neurologic:  Alert and  oriented x4;  grossly normal neurologically.  Impression/Plan: Maria Jacobs is here for an colonoscopy to be performed for colon cancer screening  Risks, benefits, limitations, and alternatives regarding  colonoscopy have been reviewed with the patient.  Questions have been answered.  All parties agreeable.   Sherri Sear, MD  10/02/2018, 11:29 AM

## 2018-10-02 NOTE — Addendum Note (Signed)
Addendum  created 10/02/18 1335 by Zetta Bills, CRNA   Intraprocedure Flowsheets edited

## 2018-10-02 NOTE — Anesthesia Postprocedure Evaluation (Signed)
Anesthesia Post Note  Patient: Maria Jacobs  Procedure(s) Performed: COLONOSCOPY WITH PROPOFOL (N/A )  Patient location during evaluation: Endoscopy Anesthesia Type: General Level of consciousness: awake and alert Pain management: pain level controlled Vital Signs Assessment: post-procedure vital signs reviewed and stable Respiratory status: spontaneous breathing, nonlabored ventilation, respiratory function stable and patient connected to nasal cannula oxygen Cardiovascular status: blood pressure returned to baseline and stable Postop Assessment: no apparent nausea or vomiting Anesthetic complications: no     Last Vitals:  Vitals:   10/02/18 1024 10/02/18 1300  BP: 113/74 (!) 87/54  Pulse: 73 96  Resp: 18 16  Temp: 36.6 C (!) 36.3 C  SpO2: 100% 98%    Last Pain:  Vitals:   10/02/18 1300  TempSrc: Tympanic  PainSc: [place]

## 2018-10-02 NOTE — Anesthesia Preprocedure Evaluation (Signed)
Anesthesia Evaluation  Patient identified by MRN, date of birth, ID band Patient awake    Reviewed: Allergy & Precautions, NPO status , Patient's Chart, lab work & pertinent test results, reviewed documented beta blocker date and time   Airway Mallampati: II  TM Distance: >3 FB     Dental  (+) Chipped   Pulmonary           Cardiovascular      Neuro/Psych    GI/Hepatic   Endo/Other    Renal/GU      Musculoskeletal   Abdominal   Peds  Hematology   Anesthesia Other Findings   Reproductive/Obstetrics                             Anesthesia Physical Anesthesia Plan  ASA: II  Anesthesia Plan: General   Post-op Pain Management:    Induction: Intravenous  PONV Risk Score and Plan:   Airway Management Planned:   Additional Equipment:   Intra-op Plan:   Post-operative Plan:   Informed Consent: I have reviewed the patients History and Physical, chart, labs and discussed the procedure including the risks, benefits and alternatives for the proposed anesthesia with the patient or authorized representative who has indicated his/her understanding and acceptance.     Plan Discussed with: CRNA  Anesthesia Plan Comments:         Anesthesia Quick Evaluation  

## 2018-10-02 NOTE — Anesthesia Post-op Follow-up Note (Signed)
Anesthesia QCDR form completed.        

## 2018-10-02 NOTE — Op Note (Signed)
[facility] Gastroenterology Patient Name: Maria Jacobs Procedure Date: 10/02/2018 12:19 PM MRN: [id number] Account #: [id number] Date of Birth: 04/04/1971 Admit Type: Outpatient Age: 48 Room: [facility] ENDO ROOM 2 Gender: Female Note Status: Finalized Procedure:            Colonoscopy Indications:          Screening for colorectal malignant neoplasm, This is                        the patient's first colonoscopy Providers:            Lin Landsman MD, MD Referring MD:         Denton Lank MD, MD (Referring MD) Medicines:            Monitored Anesthesia Care Complications:        No immediate complications. Estimated blood loss: None. Procedure:            Pre-Anesthesia Assessment:                       - Prior to the procedure, a History and Physical was                        performed, and patient medications and allergies were                        reviewed. The patient is competent. The risks and                        benefits of the procedure and the sedation options and                        risks were discussed with the patient. All questions                        were answered and informed consent was obtained.                        Patient identification and proposed procedure were                        verified by the physician, the nurse, the                        anesthesiologist, the anesthetist and the technician in                        the pre-procedure area in the procedure room in the                        endoscopy suite. Mental Status Examination: alert and                        oriented. Airway Examination: normal oropharyngeal                        airway and neck mobility. Respiratory Examination:                        clear to auscultation. CV Examination: normal.  Prophylactic Antibiotics: The patient does not require                        prophylactic antibiotics. Prior Anticoagulants: The           patient has taken no previous anticoagulant or                        antiplatelet agents. ASA Grade Assessment: II - A                        patient with mild systemic disease. After reviewing the                        risks and benefits, the patient was deemed in                        satisfactory condition to undergo the procedure. The                        anesthesia plan was to use monitored anesthesia care                        (MAC). Immediately prior to administration of                        medications, the patient was re-assessed for adequacy                        to receive sedatives. The heart rate, respiratory rate,                        oxygen saturations, blood pressure, adequacy of                        pulmonary ventilation, and response to care were                        monitored throughout the procedure. The physical status                        of the patient was re-assessed after the procedure.                       After obtaining informed consent, the colonoscope was                        passed under direct vision. Throughout the procedure,                        the patient's blood pressure, pulse, and oxygen                        saturations were monitored continuously. The                        Colonoscope was introduced through the anus and                        advanced to the the cecum, identified by appendiceal  orifice and ileocecal valve. The colonoscopy was                        technically difficult and complex due to significant                        looping and the patient's body habitus. Successful                        completion of the procedure was aided by changing the                        patient to a supine position and applying abdominal                        pressure. The patient tolerated the procedure well. The                        quality of the bowel preparation was evaluated using                         the BBPS [facility] Bowel Preparation Scale) with scores                        of: Right Colon = 3, Transverse Colon = 3 and Left                        Colon = 3 (entire mucosa seen well with no residual                        staining, small fragments of stool or opaque liquid).                        The total BBPS score equals 9. Findings:      A 5 mm polyp was found in the cecum. The polyp was sessile. The polyp       was removed with a cold snare. Resection and retrieval were complete.      Non-bleeding external hemorrhoids were found during retroflexion. The       hemorrhoids were medium-sized. There is anal papilloma arising from       external hemorrhoid      The perianal and digital rectal examinations were normal. Pertinent       negatives include normal sphincter tone, there is anal papilloma arising       from external hemorrhoid. Impression:           - Perianal skin tags found on perianal exam.                       - One 5 mm polyp in the cecum, removed with a cold                        snare. Resected and retrieved.                       - Non-bleeding external hemorrhoids. Recommendation:       - Discharge patient to home (with escort).                       -  Resume previous diet today.                       - Continue present medications.                       - Await pathology results.                       - Repeat colonoscopy in 5 years for surveillance based                        on pathology results. Procedure Code(s):    --- Professional ---                       [phone], Colonoscopy, flexible; with removal of tumor(s),                        polyp(s), or other lesion(s) by snare technique Diagnosis Code(s):    --- Professional ---                       K64.4, Residual hemorrhoidal skin tags                       Z12.11, Encounter for screening for malignant neoplasm                        of colon                       D12.0, Benign neoplasm of  cecum CPT copyright 2018 American Medical Association. All rights reserved. The codes documented in this report are preliminary and upon coder review may  be revised to meet current compliance requirements. Dr. Ulyess Mort Lin Landsman MD, MD 10/02/2018 1:01:18 PM This report has been signed electronically. Number of Addenda: 0 Note Initiated On: 10/02/2018 12:19 PM Scope Withdrawal Time: 0 hours 11 minutes 36 seconds  Total Procedure Duration: 0 hours 22 minutes 36 seconds       [facility]

## 2018-10-02 NOTE — Transfer of Care (Signed)
Immediate Anesthesia Transfer of Care Note  Patient: Maria Jacobs  Procedure(s) Performed: COLONOSCOPY WITH PROPOFOL (N/A )  Patient Location: PACU and Endoscopy Unit  Anesthesia Type:General  Level of Consciousness: awake  Airway & Oxygen Therapy: Patient Spontanous Breathing  Post-op Assessment: Report given to RN  Post vital signs: stable  Last Vitals:  Vitals Value Taken Time  BP    Temp 36.3 C 10/02/2018  1:00 PM  Pulse 96 10/02/2018  1:00 PM  Resp 16 10/02/2018  1:00 PM  SpO2 98 % 10/02/2018  1:00 PM    Last Pain:  Vitals:   10/02/18 1300  TempSrc: Tympanic  PainSc: Asleep         Complications: No apparent anesthesia complications

## 2018-10-03 ENCOUNTER — Encounter: Payer: Self-pay | Admitting: Gastroenterology

## 2018-10-04 ENCOUNTER — Encounter: Payer: Self-pay | Admitting: Gastroenterology

## 2018-10-04 ENCOUNTER — Ambulatory Visit
Admission: RE | Admit: 2018-10-04 | Discharge: 2018-10-04 | Disposition: A | Discharge status: Home / Self Care | Payer: BLUE CROSS/BLUE SHIELD | Source: Ambulatory Visit | Attending: Family Medicine | Admitting: Family Medicine

## 2018-10-04 DIAGNOSIS — Z1231 Encounter for screening mammogram for malignant neoplasm of breast: Secondary | ICD-10-CM | POA: Insufficient documentation

## 2018-10-04 LAB — SURGICAL PATHOLOGY

## 2019-10-08 ENCOUNTER — Other Ambulatory Visit: Payer: Self-pay | Admitting: Family Medicine

## 2019-10-08 DIAGNOSIS — Z1231 Encounter for screening mammogram for malignant neoplasm of breast: Secondary | ICD-10-CM

## 2020-03-18 ENCOUNTER — Other Ambulatory Visit: Payer: Self-pay | Admitting: Family Medicine

## 2020-03-18 DIAGNOSIS — Z1231 Encounter for screening mammogram for malignant neoplasm of breast: Secondary | ICD-10-CM

## 2020-09-08 ENCOUNTER — Ambulatory Visit
Admission: RE | Admit: 2020-09-08 | Discharge: 2020-09-08 | Disposition: A | Discharge status: Home / Self Care | Payer: BC Managed Care – PPO | Source: Ambulatory Visit | Attending: Family Medicine | Admitting: Family Medicine

## 2020-09-08 ENCOUNTER — Other Ambulatory Visit: Payer: Self-pay

## 2020-09-08 DIAGNOSIS — Z1231 Encounter for screening mammogram for malignant neoplasm of breast: Secondary | ICD-10-CM | POA: Insufficient documentation

## 2021-04-04 ENCOUNTER — Emergency Department
Admission: EM | Admit: 2021-04-04 | Discharge: 2021-04-04 | Disposition: A | Discharge status: Home / Self Care | Payer: Self-pay | Attending: Family Medicine | Admitting: Family Medicine

## 2021-04-04 ENCOUNTER — Encounter: Payer: Self-pay | Admitting: Emergency Medicine

## 2021-04-04 ENCOUNTER — Other Ambulatory Visit: Payer: Self-pay

## 2021-04-04 DIAGNOSIS — L509 Urticaria, unspecified: Secondary | ICD-10-CM | POA: Insufficient documentation

## 2021-04-04 MED ORDER — PREDNISONE 10 MG (21) PO TBPK: ORAL_TABLET | ORAL | 0 refills | Status: DC

## 2021-04-04 NOTE — ED Provider Notes (Signed)
[facility] Emergency Department Provider Note  ____________________________________________  Time seen: Approximately 2:40 PM  I have reviewed the triage vital signs and the nursing notes.   HISTORY  Chief Complaint Insect Bite   HPI Maria Jacobs is a 50 y.o. female for evaluation of itching and swelling of face. Symptoms started yesterday after working outside and was worse upon awakening this morning. Took benadryl last night without relief.    History reviewed. No pertinent past medical history.  Patient Active Problem List   Diagnosis Date Noted   Encounter for screening colonoscopy    Bilateral breast cysts 02/21/2015    Past Surgical History:  Procedure Laterality Date   BREAST CYST ASPIRATION Right 07-26-13   BREAST CYST ASPIRATION Bilateral 03/05/2015   COLONOSCOPY WITH PROPOFOL N/A 10/02/2018   Procedure: COLONOSCOPY WITH PROPOFOL;  Surgeon: Lin Landsman, MD;  Location: [facility];  Service: Gastroenterology;  Laterality: N/A;   OVARIAN CYST SURGERY  1990    Prior to Admission medications   Medication Sig Start Date End Date Taking? Authorizing Provider  predniSONE (STERAPRED UNI-PAK 21 TAB) 10 MG (21) TBPK tablet Take 6 tablets on the first day and decrease by 1 tablet each day until finished. 04/04/21  Yes Magda Muise B, FNP  ibuprofen (ADVIL,MOTRIN) 800 MG tablet Take 800 mg by mouth every 8 (eight) hours as needed.    [provider]    Allergies Patient has no known allergies.  Family History  Adopted: Yes  Problem Relation Age of Onset   Asthma Mother    Breast cancer Mother     Social History Social History   Tobacco Use   Smoking status: Never   Smokeless tobacco: Never  Vaping Use   Vaping Use: Never used  Substance Use Topics   Alcohol use: No   Drug use: No    Review of Systems  Constitutional: Negative for fever. Respiratory: Negative for cough or shortness of breath.  Musculoskeletal:  Negative for myalgias Skin: Positive for urticaria, diffuse swelling around left eye. Neurological: Negative for numbness or paresthesias. ____________________________________________   PHYSICAL EXAM:  Today's Vitals   04/04/21 1440 04/04/21 1442 04/04/21 1458  BP:  116/78   Pulse:  70   Resp:  20   Temp:  98.5 F (36.9 C)   TempSrc:  Oral   SpO2:  98%   Weight: 68 kg    Height: '4\' 11"'$  (1.499 m)    PainSc: 0-No pain  0-No pain   Body mass index is 30.3 kg/m.     Constitutional: Well appearing. Eyes: Conjunctivae are clear without discharge or drainage. Nose: No rhinorrhea noted. Mouth/Throat: Airway is patent.  Neck: No stridor. Unrestricted range of motion observed. Cardiovascular: Capillary refill is <3 seconds.  Respiratory: Respirations are even and unlabored.. Musculoskeletal: Unrestricted range of motion observed. Neurologic: Awake, alert, and oriented x 4.  Skin: diffuse swelling around left eye and face with hives on left side of neck.  ____________________________________________   LABS (all labs ordered are listed, but only abnormal results are displayed)  Labs Reviewed - No data to display ____________________________________________  EKG  Not indicated. ____________________________________________  RADIOLOGY  Not indicated. ____________________________________________   PROCEDURES  Procedures ____________________________________________   INITIAL IMPRESSION / ASSESSMENT AND PLAN / ED COURSE  Maria Jacobs is a 50 y.o. female presenting to the ER for treatment of facial swelling and itching. See HPI.   Plan will be to have her take benadryl, pepcid, and prescribe a tapered prednisone  pack.  She was advised to return to the ER for symptoms of concern if unable to see primary care.   Medications - No data to display   Pertinent labs & imaging results that were available during my care of the patient were reviewed by me and considered in  my medical decision making (see chart for details).  ____________________________________________   FINAL CLINICAL IMPRESSION(S) / ED DIAGNOSES  Final diagnoses:  Urticaria    ED Discharge Orders          Ordered    predniSONE (STERAPRED UNI-PAK 21 TAB) 10 MG (21) TBPK tablet        04/04/21 1445             Note:  This document was prepared using Dragon voice recognition software and may include unintentional dictation errors.   Victorino Dike, FNP 04/04/21 1543    Lucrezia Starch, MD 04/04/21 [phone]

## 2021-04-04 NOTE — ED Triage Notes (Signed)
Pt reports was stung or bit by something on her face and now has some puffiness to the left side of her face. Pt reports area itches

## 2021-04-04 NOTE — Discharge Instructions (Signed)
Please take Benadryl '25mg'$  every 6 hours and Pepcid 2 times per day in addition to the prednisone.  Return to the ER for shortness of breath or other concerns.

## 2022-02-25 ENCOUNTER — Other Ambulatory Visit: Payer: Self-pay | Admitting: Family Medicine

## 2022-02-25 DIAGNOSIS — Z1231 Encounter for screening mammogram for malignant neoplasm of breast: Secondary | ICD-10-CM

## 2022-08-26 ENCOUNTER — Ambulatory Visit
Admission: RE | Admit: 2022-08-26 | Discharge: 2022-08-26 | Disposition: A | Discharge status: Home / Self Care | Payer: BC Managed Care – PPO | Source: Ambulatory Visit | Attending: Family Medicine | Admitting: Family Medicine

## 2022-08-26 ENCOUNTER — Ambulatory Visit: Payer: Self-pay

## 2022-08-26 DIAGNOSIS — Z1231 Encounter for screening mammogram for malignant neoplasm of breast: Secondary | ICD-10-CM | POA: Diagnosis present

## 2022-10-26 ENCOUNTER — Ambulatory Visit (LOCAL_COMMUNITY_HEALTH_CENTER): Payer: Self-pay

## 2022-10-26 DIAGNOSIS — Z111 Encounter for screening for respiratory tuberculosis: Secondary | ICD-10-CM

## 2022-10-29 ENCOUNTER — Ambulatory Visit (LOCAL_COMMUNITY_HEALTH_CENTER): Payer: Self-pay

## 2022-10-29 DIAGNOSIS — Z111 Encounter for screening for respiratory tuberculosis: Secondary | ICD-10-CM

## 2022-10-29 LAB — TB SKIN TEST
Induration: 0 mm
TB Skin Test: NEGATIVE

## 2024-02-07 ENCOUNTER — Other Ambulatory Visit: Payer: Self-pay

## 2024-02-07 ENCOUNTER — Emergency Department
Admission: EM | Admit: 2024-02-07 | Discharge: 2024-02-07 | Disposition: A | Discharge status: Home / Self Care | Payer: Self-pay | Attending: Emergency Medicine | Admitting: Emergency Medicine

## 2024-02-07 ENCOUNTER — Emergency Department: Payer: Self-pay

## 2024-02-07 DIAGNOSIS — R053 Chronic cough: Secondary | ICD-10-CM

## 2024-02-07 DIAGNOSIS — R062 Wheezing: Secondary | ICD-10-CM

## 2024-02-07 DIAGNOSIS — J189 Pneumonia, unspecified organism: Secondary | ICD-10-CM

## 2024-02-07 LAB — RESP PANEL BY RT-PCR (RSV, FLU A&B, COVID)  RVPGX2
Influenza A by PCR: NEGATIVE
Influenza B by PCR: NEGATIVE
Resp Syncytial Virus by PCR: NEGATIVE
SARS Coronavirus 2 by RT PCR: NEGATIVE

## 2024-02-07 MED ORDER — HYDROCOD POLI-CHLORPHE POLI ER 10-8 MG/5ML PO SUER
5.0000 mL | Freq: Once | ORAL | Status: AC
  Administered 2024-02-07: 5 mL via ORAL
  Filled 2024-02-07: qty 5

## 2024-02-07 MED ORDER — IPRATROPIUM-ALBUTEROL 0.5-2.5 (3) MG/3ML IN SOLN
6.0000 mL | Freq: Once | RESPIRATORY_TRACT | Status: AC
  Administered 2024-02-07: 6 mL via RESPIRATORY_TRACT
  Filled 2024-02-07: qty 6

## 2024-02-07 MED ORDER — PREDNISONE 50 MG PO TABS: 50.0000 mg | ORAL_TABLET | Freq: Every day | ORAL | 0 refills | Status: AC

## 2024-02-07 MED ORDER — DOXYCYCLINE HYCLATE 100 MG PO TABS
100.0000 mg | ORAL_TABLET | Freq: Once | ORAL | Status: AC
  Administered 2024-02-07: 100 mg via ORAL
  Filled 2024-02-07: qty 1

## 2024-02-07 MED ORDER — PREDNISONE 20 MG PO TABS
60.0000 mg | ORAL_TABLET | Freq: Once | ORAL | Status: AC
  Administered 2024-02-07: 60 mg via ORAL
  Filled 2024-02-07: qty 3

## 2024-02-07 MED ORDER — HYDROCOD POLI-CHLORPHE POLI ER 10-8 MG/5ML PO SUER: 5.0000 mL | Freq: Two times a day (BID) | ORAL | 0 refills | Status: DC | PRN

## 2024-02-07 MED ORDER — DOXYCYCLINE HYCLATE 100 MG PO TABS: 100.0000 mg | ORAL_TABLET | Freq: Two times a day (BID) | ORAL | 0 refills | Status: AC

## 2024-02-07 MED ORDER — ALBUTEROL SULFATE HFA 108 (90 BASE) MCG/ACT IN AERS: 2.0000 | INHALATION_SPRAY | Freq: Four times a day (QID) | RESPIRATORY_TRACT | 2 refills | Status: DC | PRN

## 2024-02-07 NOTE — Discharge Instructions (Addendum)
 Albuterol inhaler to use as needed moving forward for any cough, wheezing or feeling short of breath.  Use 1-2 puffs at a time, every 4-6 hours as needed  Prednisone  steroids once daily for 4 more days to finish 5 days  Doxycycline antibiotics twice daily for a week to treat pneumonia  Tussionex cough syrup to use as needed for cough, help sleep at night  Reach out to the pulmonologist to be seen in the clinic and return to the ED with any worsening symptoms despite these measures

## 2024-02-07 NOTE — ED Provider Notes (Signed)
 [facility] Provider Note    Event Date/Time   First MD Initiated Contact with Patient 02/07/24 1703     (approximate)   History   Cough   HPI  Maria Jacobs is a 53 y.o. female who presents to the ED for evaluation of Cough   I review read of an outpatient x-ray performed on 5/2 without evidence of acute pathology.   Patient presents for evaluation of 3 months of a persistent minimally productive cough.  She has a son with a history of asthma but she has no history of this, never cigarette smoker or history of COPD.  She is largely healthy without regular prescriptions or known medical issues.  She reports no fevers, chest pressure or dyspnea.  Reports sometimes feeling short of breath at the end of a coughing fit, but no dyspnea on exertion.  No changes to the quality of the cough such as increased sputum, no epigastric pain, postprandial nausea or emesis, no reflux.   Physical Exam   Triage Vital Signs: ED Triage Vitals  Encounter Vitals Group     BP 02/07/24 1645 123/67     Systolic BP Percentile --      Diastolic BP Percentile --      Pulse Rate 02/07/24 1645 91     Resp 02/07/24 1645 15     Temp 02/07/24 1645 98.3 F (36.8 C)     Temp Source 02/07/24 1645 Oral     SpO2 02/07/24 1645 100 %     Weight 02/07/24 1647 150 lb (68 kg)     Height 02/07/24 1647 4\' 11"  (1.499 m)     Head Circumference --      Peak Flow --      Pain Score 02/07/24 1646 7     Pain Loc --      Pain Education --      Exclude from Growth Chart --     Most recent vital signs: Vitals:   02/07/24 1645  BP: 123/67  Pulse: 91  Resp: 15  Temp: 98.3 F (36.8 C)  SpO2: 100%    General: Awake, no distress.  Seems quite uncomfortable with a minimally productive cough.  Spitting a thin foamy white mucus into an emesis bag. CV:  Good peripheral perfusion.  Resp:  Normal effort.  Faint and scattered expiratory wheezes are present Abd:  No distention.  Soft  benign MSK:  No deformity noted.  No lower extremity edema Neuro:  No focal deficits appreciated. Other:     ED Results / Procedures / Treatments   Labs (all labs ordered are listed, but only abnormal results are displayed) Labs Reviewed  RESP PANEL BY RT-PCR (RSV, FLU A&B, COVID)  [id number]    EKG   RADIOLOGY 2 view chest x-ray interpreted by me and right midlung opacity is noted without pneumothorax or effusion  Official radiology report(s): DG Chest 2 View Result Date: 02/07/2024 CLINICAL DATA:  Cough for 3 months EXAM: CHEST - 2 VIEW COMPARISON:  None Available. FINDINGS: The heart size and mediastinal contours are within normal limits. Nodular opacity in the right mid to upper lung could represent a small pneumonia or volume loss. Follow up is recommended to ensure resolution as a precaution. Lungs are otherwise clear. No pneumothorax or pleural effusion. Normal pulmonary vasculature. IMPRESSION: Nodular opacity in the right mid to upper lung could represent a small pneumonia or volume loss. Follow up recommended to ensure resolution. Electronically Signed   By: Melodie Spry  Pleasure M.D.   On: 02/07/2024 18:13    PROCEDURES and INTERVENTIONS:  Procedures  Medications  doxycycline (VIBRA-TABS) tablet 100 mg (100 mg Oral Given 02/07/24 1837)  predniSONE  (DELTASONE ) tablet 60 mg (60 mg Oral Given 02/07/24 1837)  ipratropium-albuterol (DUONEB) 0.5-2.5 (3) MG/3ML nebulizer solution 6 mL (6 mLs Nebulization Given 02/07/24 1837)  chlorpheniramine-HYDROcodone (TUSSIONEX) 10-8 MG/5ML suspension 5 mL (5 mLs Oral Given 02/07/24 1837)     IMPRESSION / MDM / ASSESSMENT AND PLAN / ED COURSE  I reviewed the triage vital signs and the nursing notes.  Differential diagnosis includes, but is not limited to, wheezing, new onset CHF, pneumonia, pneumothorax  {Patient presents with symptoms of an acute illness or injury that is potentially life-threatening.  Generally healthy 53 year old presents  with a chronic minimally productive cough.  Does have a small infiltrate on x-ray so we will start her on doxycycline.  Does have wheezing on exam and I suspect she would benefit from breathing treatments and a course of steroids.  Will provide these medications as well as antitussive and reassess.  Suspect she will be suitable for outpatient management  I considered admission for this patient but she improved dramatically with the breathing treatment and starting oral medications and ultimately a believe she is suitable for outpatient management with pulmonary referral, ED return precautions  Clinical Course as of 02/07/24 1924  Tue Feb 07, 2024  1920 Reassessed and reexamined.  Patient reports feeling better, wheezing has resolved.  We discussed inhaler at home, steroids and antibiotics for a few days.  Pulmonology follow-up.  Answered questions.  She was appreciative [DS]    Clinical Course User Index [DS] Arline Bennett, MD     FINAL CLINICAL IMPRESSION(S) / ED DIAGNOSES   Final diagnoses:  Chronic cough  Wheezing  Community acquired pneumonia of right middle lobe of lung     Rx / DC Orders   ED Discharge Orders          Ordered    chlorpheniramine-HYDROcodone (TUSSIONEX) 10-8 MG/5ML  Every 12 hours PRN        02/07/24 1922    doxycycline (VIBRA-TABS) 100 MG tablet  2 times daily        02/07/24 1922    predniSONE  (DELTASONE ) 50 MG tablet  Daily        02/07/24 1922    albuterol (VENTOLIN HFA) 108 (90 Base) MCG/ACT inhaler  Every 6 hours PRN        02/07/24 1922             Note:  This document was prepared using Dragon voice recognition software and may include unintentional dictation errors.   Arline Bennett, MD 02/07/24 [phone]

## 2024-02-07 NOTE — ED Triage Notes (Signed)
 Pt arrived via POV with c/o a cough per 3/months. Pt is coughing up clear liquid that is foamy everyday. Pt has tried OTC medicines with no relief. Pt has developed a pain in their lower back on the lower right side. Pt states that the coughing has started to affect their bladder control. Pt denies fever, N/V, SOB. Pt states the phlegm is sitting in the front of their chest.

## 2024-03-11 ENCOUNTER — Other Ambulatory Visit: Payer: Self-pay

## 2024-03-11 ENCOUNTER — Emergency Department: Payer: Self-pay

## 2024-03-11 ENCOUNTER — Emergency Department
Admission: EM | Admit: 2024-03-11 | Discharge: 2024-03-11 | Disposition: A | Discharge status: Home / Self Care | Payer: Self-pay | Attending: Emergency Medicine | Admitting: Emergency Medicine

## 2024-03-11 DIAGNOSIS — J449 Chronic obstructive pulmonary disease, unspecified: Secondary | ICD-10-CM | POA: Insufficient documentation

## 2024-03-11 DIAGNOSIS — R053 Chronic cough: Secondary | ICD-10-CM

## 2024-03-11 DIAGNOSIS — I10 Essential (primary) hypertension: Secondary | ICD-10-CM | POA: Insufficient documentation

## 2024-03-11 DIAGNOSIS — Z7901 Long term (current) use of anticoagulants: Secondary | ICD-10-CM | POA: Insufficient documentation

## 2024-03-11 DIAGNOSIS — K7031 Alcoholic cirrhosis of liver with ascites: Secondary | ICD-10-CM | POA: Insufficient documentation

## 2024-03-11 LAB — RESP PANEL BY RT-PCR (RSV, FLU A&B, COVID)  RVPGX2
Influenza A by PCR: NEGATIVE
Influenza B by PCR: NEGATIVE
Resp Syncytial Virus by PCR: NEGATIVE
SARS Coronavirus 2 by RT PCR: NEGATIVE

## 2024-03-11 MED ORDER — PREDNISONE 10 MG (21) PO TBPK: ORAL_TABLET | ORAL | 0 refills | Status: DC

## 2024-03-11 MED ORDER — ALBUTEROL SULFATE HFA 108 (90 BASE) MCG/ACT IN AERS: 2.0000 | INHALATION_SPRAY | Freq: Four times a day (QID) | RESPIRATORY_TRACT | 2 refills | Status: DC | PRN

## 2024-03-11 NOTE — ED Triage Notes (Signed)
 P tto ED for cough since 4 months with clear sputum. Seen here for same in May and prescribed something, then when medication ran out her cough started again. Pt in NAD. Respirations unlabored.

## 2024-03-11 NOTE — Discharge Instructions (Signed)
 Call the pulmonologist listed on your discharge papers tomorrow to get an appointment for follow-up of your chronic cough.  2 prescriptions were sent to the pharmacy the 1 is the prednisone  that we discussed.  The other is an inhaler that she can use every 6 hours as needed for any wheezing, shortness of breath or cough.  This should help with the spasmatic cough that you described.  Further test are going to be needed to find the source of your chronic cough.

## 2024-03-11 NOTE — ED Provider Notes (Signed)
 [facility] Provider Note    Event Date/Time   First MD Initiated Contact with Patient 03/11/24 1201     (approximate)   History   Cough   HPI  Maria Jacobs is a 53 y.o. female   presents to the ED with complaint of cough for the last 4 to 5 months.  Patient was seen in the ED for the same at which time she was prescribed an antibiotic and prednisone  which she states helped until she ran out of medication.  She does have a PCP but reports that her doctor has been out of the office either for personal reasons are on vacation.  Patient denies history of smoking, asthma, fever or chills.  She also denies any history of reflux or symptoms to suggest GERD.  Patient reports coughing is at any time during the day or night.      Physical Exam   Triage Vital Signs: ED Triage Vitals  Encounter Vitals Group     BP 03/11/24 1152 116/78     Girls Systolic BP Percentile --      Girls Diastolic BP Percentile --      Boys Systolic BP Percentile --      Boys Diastolic BP Percentile --      Pulse Rate 03/11/24 1152 94     Resp 03/11/24 1152 16     Temp 03/11/24 1152 97.9 F (36.6 C)     Temp Source 03/11/24 1152 Oral     SpO2 03/11/24 1152 100 %     Weight 03/11/24 1153 162 lb (73.5 kg)     Height 03/11/24 1153 4' 11 (1.499 m)     Head Circumference --      Peak Flow --      Pain Score 03/11/24 1151 0     Pain Loc --      Pain Education --      Exclude from Growth Chart --     Most recent vital signs: Vitals:   03/11/24 1152  BP: 116/78  Pulse: 94  Resp: 16  Temp: 97.9 F (36.6 C)  SpO2: 100%     General: Awake, no distress.  Able to talk in complete sentences without any difficulty. CV:  Good peripheral perfusion.  Heart regular rate and rhythm. Resp:  Normal effort.  Lungs clear bilaterally. Abd:  No distention.  Other:     ED Results / Procedures / Treatments   Labs (all labs ordered are listed, but only abnormal results are  displayed) Labs Reviewed  RESP PANEL BY RT-PCR (RSV, FLU A&B, COVID)  [id number]       RADIOLOGY  Chest x-ray images reviewed by myself independent of the radiologist and no cardiopulmonary abnormality noted.  Official radiology report agrees.   PROCEDURES:  Critical Care performed:   Procedures   MEDICATIONS ORDERED IN ED: Medications - No data to display   IMPRESSION / MDM / ASSESSMENT AND PLAN / ED COURSE  I reviewed the triage vital signs and the nursing notes.   Differential diagnosis includes, but is not limited to, COVID, influenza, RSV, bronchitis, pneumonia, seasonal allergies, bronchospasms  53 year old female presents to the ED with continued clear productive cough lasting approximately 4 months.  Patient has not followed up with her PCP as she states that she is either out of the office or on medical leave.  I discussed respiratory panel being negative along with results of her chest x-ray.  In talking with her she states  that the steroids seem to help a great deal.  I do not feel at this time that she needs antibiotics as I have not found a source for infection nor has she had any symptoms that suggest infection.  I did talk to her about make an appointment with a pulmonologist for further evaluation of her chronic cough.  Patient denies being on any medications.  She agrees with this plan and also a another course of prednisone  and an albuterol  inhaler was sent to the pharmacy for her to begin using.      Patient's presentation is most consistent with acute complicated illness / injury requiring diagnostic workup.  FINAL CLINICAL IMPRESSION(S) / ED DIAGNOSES   Final diagnoses:  Chronic cough     Rx / DC Orders   ED Discharge Orders          Ordered    predniSONE  (STERAPRED UNI-PAK 21 TAB) 10 MG (21) TBPK tablet        03/11/24 1321    albuterol  (VENTOLIN  HFA) 108 (90 Base) MCG/ACT inhaler  Every 6 hours PRN        03/11/24 1321             Note:   This document was prepared using Dragon voice recognition software and may include unintentional dictation errors.   Saunders Shona CROME, PA-C 03/11/24 1423    Dorothyann Drivers, MD 03/11/24 1506

## 2024-03-20 ENCOUNTER — Encounter: Payer: Self-pay | Admitting: Family Medicine

## 2024-03-20 ENCOUNTER — Other Ambulatory Visit: Payer: Self-pay

## 2024-03-20 DIAGNOSIS — N6325 Unspecified lump in the left breast, overlapping quadrants: Secondary | ICD-10-CM

## 2024-04-16 ENCOUNTER — Ambulatory Visit: Payer: Self-pay

## 2024-04-18 ENCOUNTER — Ambulatory Visit: Payer: Self-pay

## 2024-04-23 ENCOUNTER — Ambulatory Visit
Admission: RE | Admit: 2024-04-23 | Discharge: 2024-04-23 | Disposition: A | Discharge status: Home / Self Care | Payer: Self-pay | Source: Ambulatory Visit | Attending: Obstetrics and Gynecology | Admitting: Obstetrics and Gynecology

## 2024-04-23 ENCOUNTER — Ambulatory Visit: Payer: Self-pay | Attending: Obstetrics and Gynecology | Admitting: *Deleted

## 2024-04-23 VITALS — BP 125/87 | Wt 160.5 lb

## 2024-04-23 DIAGNOSIS — N6321 Unspecified lump in the left breast, upper outer quadrant: Secondary | ICD-10-CM

## 2024-04-23 DIAGNOSIS — N6325 Unspecified lump in the left breast, overlapping quadrants: Secondary | ICD-10-CM | POA: Insufficient documentation

## 2024-04-23 DIAGNOSIS — Z1239 Encounter for other screening for malignant neoplasm of breast: Secondary | ICD-10-CM

## 2024-04-23 NOTE — Patient Instructions (Signed)
 Explained breast self awareness with Maria Jacobs. Patient did not need a Pap smear today due to last Pap smear and HPV typing was 02/23/2022. Let her know BCCCP will cover Pap smears and HPV typing every 5 years unless has a history of abnormal Pap smears. Referred patient to the [facility] for a diagnostic mammogram. Appointment scheduled Monday, April 23, 2024 at 1000. Patient aware of appointment and will be there. Maria Jacobs verbalized understanding.  Katiya Fike, Wanda Ship, RN 8:36 AM

## 2024-04-23 NOTE — Progress Notes (Signed)
 Ms. Maria Jacobs is a 53 y.o. female who presents to [facility] clinic today with complaint of left upper breast lump x 1 month that increases in size during her menstrual cycle.    Pap Smear: Pap smear not completed today. Last Pap smear was 02/23/2022 at [facility] clinic and was normal with negative HPV. Per patient has no history of an abnormal Pap smear. Last Pap smear result is available in Epic.   Physical exam: Breasts Right breast greater than left breast that per patient is normal for her. No skin abnormalities bilateral breasts. No nipple retraction bilateral breasts. No nipple discharge bilateral breasts. No lymphadenopathy. No lumps palpated right breast. Palpated a pea sized lump within the left breast at 1 o'clock next to nipple. No complaints of pain or tenderness on exam.  MM 3D SCREEN BREAST BILATERAL Result Date: 08/27/2022 CLINICAL DATA:  Screening. EXAM: DIGITAL SCREENING BILATERAL MAMMOGRAM WITH TOMOSYNTHESIS AND CAD TECHNIQUE: Bilateral screening digital craniocaudal and mediolateral oblique mammograms were obtained. Bilateral screening digital breast tomosynthesis was performed. The images were evaluated with computer-aided detection. COMPARISON:  Previous exam(s). ACR Breast Density Category d: The breast tissue is extremely dense, which lowers the sensitivity of mammography FINDINGS: There are no findings suspicious for malignancy. IMPRESSION: No mammographic evidence of malignancy. A result letter of this screening mammogram will be mailed directly to the patient. RECOMMENDATION: Screening mammogram in one year. (Code:SM-B-01Y) BI-RADS CATEGORY  1: Negative. Electronically Signed   By: Maria Jacobs M.D.   On: 08/27/2022 16:59   MM 3D SCREEN BREAST BILATERAL Result Date: 09/09/2020 CLINICAL DATA:  Screening. EXAM: DIGITAL SCREENING BILATERAL MAMMOGRAM WITH TOMO AND CAD COMPARISON:  Previous exam(s). ACR Breast Density Category c: The breast tissue is heterogeneously dense,  which may obscure small masses. FINDINGS: There are no findings suspicious for malignancy. Images were processed with CAD. IMPRESSION: No mammographic evidence of malignancy. A result letter of this screening mammogram will be mailed directly to the patient. RECOMMENDATION: Screening mammogram in one year. (Code:SM-B-01Y) BI-RADS CATEGORY  1: Negative. Electronically Signed   By: Maria Jacobs M.D.   On: 09/09/2020 13:12   Pelvic/Bimanual Pap is not indicated today per BCCCP guidelines.   Smoking History: Patient has never smoked.   Patient Navigation: Patient education provided. Access to services provided for patient through [facility] program.   Colorectal Cancer Screening: Per patient has had colonoscopy completed on 10/02/2018 at [facility]. No complaints today.    Breast and Cervical Cancer Risk Assessment: Patient does not have family history of breast cancer, known genetic mutations, or radiation treatment to the chest before age 61. Patient does not have history of cervical dysplasia, immunocompromised, or DES exposure in-utero.  Risk Scores as of Encounter on 04/23/2024     Maria Jacobs           5-year 1.69%   Lifetime 9.56%            Last calculated by Maria Tempie SQUIBB, LPN on 1/88/7974 at  8:54 AM       A: B[facility]CP exam without pap smear Complaint of left breast lump.  P: Referred patient to the [facility] for a diagnostic mammogram. Appointment scheduled Monday, April 23, 2024 at 1000.  Maria Wanda SQUIBB, RN 04/23/2024 8:36 AM

## 2024-05-30 ENCOUNTER — Other Ambulatory Visit: Payer: Self-pay

## 2024-05-30 ENCOUNTER — Encounter: Payer: Self-pay | Admitting: *Deleted

## 2024-05-30 ENCOUNTER — Emergency Department: Payer: Self-pay

## 2024-05-30 ENCOUNTER — Emergency Department
Admission: EM | Admit: 2024-05-30 | Discharge: 2024-05-30 | Disposition: A | Discharge status: Home / Self Care | Payer: Self-pay | Attending: Emergency Medicine | Admitting: Emergency Medicine

## 2024-05-30 DIAGNOSIS — S30811A Abrasion of abdominal wall, initial encounter: Secondary | ICD-10-CM | POA: Diagnosis not present

## 2024-05-30 DIAGNOSIS — R519 Headache, unspecified: Secondary | ICD-10-CM | POA: Diagnosis not present

## 2024-05-30 DIAGNOSIS — M25562 Pain in left knee: Secondary | ICD-10-CM | POA: Diagnosis not present

## 2024-05-30 DIAGNOSIS — M542 Cervicalgia: Secondary | ICD-10-CM | POA: Diagnosis not present

## 2024-05-30 DIAGNOSIS — S3991XA Unspecified injury of abdomen, initial encounter: Secondary | ICD-10-CM | POA: Diagnosis present

## 2024-05-30 DIAGNOSIS — M25551 Pain in right hip: Secondary | ICD-10-CM | POA: Insufficient documentation

## 2024-05-30 DIAGNOSIS — Y92410 Unspecified street and highway as the place of occurrence of the external cause: Secondary | ICD-10-CM | POA: Diagnosis not present

## 2024-05-30 DIAGNOSIS — S60512A Abrasion of left hand, initial encounter: Secondary | ICD-10-CM | POA: Insufficient documentation

## 2024-05-30 MED ORDER — CYCLOBENZAPRINE HCL 5 MG PO TABS: 5.0000 mg | ORAL_TABLET | Freq: Three times a day (TID) | ORAL | 0 refills | Status: DC | PRN

## 2024-05-30 MED ORDER — CYCLOBENZAPRINE HCL 10 MG PO TABS
5.0000 mg | ORAL_TABLET | Freq: Once | ORAL | Status: AC
Start: 2024-05-30 — End: 2024-05-30
  Administered 2024-05-30: 5 mg via ORAL
  Filled 2024-05-30: qty 1

## 2024-05-30 MED ORDER — ACETAMINOPHEN 500 MG PO TABS
1000.0000 mg | ORAL_TABLET | Freq: Once | ORAL | Status: AC
  Administered 2024-05-30: 1000 mg via ORAL
  Filled 2024-05-30: qty 2
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
## 2024-07-02 ENCOUNTER — Other Ambulatory Visit

## 2024-07-04 ENCOUNTER — Other Ambulatory Visit: Payer: Self-pay

## 2024-09-12 ENCOUNTER — Ambulatory Visit (HOSPITAL_BASED_OUTPATIENT_CLINIC_OR_DEPARTMENT_OTHER): Payer: Self-pay

## 2024-09-12 ENCOUNTER — Ambulatory Visit (HOSPITAL_BASED_OUTPATIENT_CLINIC_OR_DEPARTMENT_OTHER): Payer: Self-pay | Admitting: Pulmonary Disease

## 2024-09-12 ENCOUNTER — Encounter (HOSPITAL_BASED_OUTPATIENT_CLINIC_OR_DEPARTMENT_OTHER): Payer: Self-pay | Admitting: Pulmonary Disease

## 2024-09-12 VITALS — BP 115/77 | HR 74 | Ht 59.0 in | Wt 163.0 lb

## 2024-09-12 DIAGNOSIS — K219 Gastro-esophageal reflux disease without esophagitis: Secondary | ICD-10-CM

## 2024-09-12 DIAGNOSIS — R053 Chronic cough: Secondary | ICD-10-CM

## 2024-09-12 NOTE — Patient Instructions (Signed)
" °  VISIT SUMMARY: During your visit, we discussed your persistent cough that has been ongoing since your pneumonia last year. We explored possible causes, including asthma and gastroesophageal reflux disease (GERD), and planned several tests and lifestyle changes to help manage your symptoms.  YOUR PLAN: CHRONIC COUGH: You have had a persistent cough with clear, bubbly phlegm, especially at night, since your pneumonia last year. Triggers include warm environments, spicy foods, and strong smells. -We ordered a chest x-ray to rule out any remaining lung issues. -We also ordered a pulmonary function test to check for asthma. -Use saline nasal spray to help reduce post-nasal drip. -Make lifestyle changes to reduce reflux symptoms, such as avoiding spicy foods, citrus, and caffeine, and eating 2-3 hours before bedtime.  SUSPECTED GASTROESOPHAGEAL REFLUX DISEASE (GERD): Your symptoms suggest silent reflux, even though you do not have heartburn. Spicy foods and citrus seem to make your symptoms worse. -Take famotidine 20 mg at night to help manage reflux symptoms. -Make lifestyle changes to reduce reflux, such as avoiding spicy foods, citrus, and caffeine, and eating 2-3 hours before bedtime.  SUSPECTED ASTHMA: Given your family history and persistent cough, we suspect asthma as a possible cause. -We ordered a pulmonary function test to evaluate for asthma. -We will discuss the use of inhalers if asthma is confirmed after testing.   Contains text generated by Abridg "

## 2024-09-12 NOTE — Progress Notes (Signed)
 "  New Patient Pulmonology Office Visit   Subjective:  Patient ID: Maria Jacobs, female    DOB: 08-07-1971  MRN: [id number]  Referred by: Tobie Domino, MD  CC:  Chief Complaint  Patient presents with   Establish Care    HPI Maria Jacobs is a 53 y.o. female, previously healthy, who presents for initial evaluation of chronic cough.  Discussed the use of AI scribe software for clinical note transcription with the patient, who gave verbal consent to proceed.  History of Present Illness Maria Jacobs is a 53 year old female who presents with a persistent cough following pneumonia.  She has experienced a persistent cough since being diagnosed with pneumonia approximately one year ago. The pneumonia was incidentally discovered during hospitalization for an unrelated illness, confirmed by an x-ray, and treated with antibiotics. The cough is characterized by spurts of clear, bubbly phlegm, more pronounced at night and upon waking, necessitating throat clearing. Triggers include warm environments, spicy foods, and strong smells, while cold air, pollen, and pet dander do not exacerbate it. She previously had a non-hypoallergenic dog, which she has since rehomed.  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 She attributes some of this to being overweight, noting a need to lose 15-20 pounds. No wheezing or chest tightness is reported.  She has tried albuterol  and prednisone  in the past, with prednisone  and a cough suppressant providing some relief, particularly at night. However, she is concerned about the side effects and cost of these medications. Albuterol  has not been effective.  Her family history includes asthma affecting her sons and mother. She has never been diagnosed with asthma herself. She denies any history of smoking or exposure to secondhand smoke, although her ex-husband was a  smoker.  No heartburn is reported, but she describes her phlegm as 'stringy' and 'bubbly.' Spicy foods and citrus exacerbate her symptoms. She has not had a breathing test previously.  ROS  Allergies: Wound dressing adhesive Current Medications[1] History reviewed. No pertinent past medical history. Past Surgical History:  Procedure Laterality Date   BREAST BIOPSY Left    pt states done at [facility]   BREAST CYST ASPIRATION Right 07/26/2013   BREAST CYST ASPIRATION Bilateral 03/05/2015   COLONOSCOPY WITH PROPOFOL  N/A 10/02/2018   Procedure: COLONOSCOPY WITH PROPOFOL ;  Surgeon: Unk Corinn Skiff, MD;  Location[facility] ENDOSCOPY;  Service: Gastroenterology;  Laterality: N/A;   OVARIAN CYST SURGERY  1990   Family History  Adopted: Yes  Problem Relation Age of Onset   Asthma Mother    Breast cancer Mother    Social History   Socioeconomic History   Marital status: Married    Spouse name: Not on file   Number of children: 2   Years of education: Not on file   Highest education level: Associate degree: academic program  Occupational History   Not on file  Tobacco Use   Smoking status: Never   Smokeless tobacco: Never  Vaping Use   Vaping status: Never Used  Substance and Sexual Activity   Alcohol use: No   Drug use: No   Sexual activity: Yes  Other Topics Concern   Not on file  Social History Narrative   Not on file   Social Drivers of Health   Tobacco Use: Low Risk (09/12/2024)   Patient History    Smoking Tobacco Use: Never    Smokeless Tobacco Use: Never  Passive Exposure: Not on file  Financial Resource Strain: Not on file  Food Insecurity: No Food Insecurity (04/23/2024)   Epic    Worried About Programme Researcher, Broadcasting/film/video in the Last Year: Never true    Ran Out of Food in the Last Year: Never true  Transportation Needs: No Transportation Needs (04/23/2024)   Epic    Lack of Transportation (Medical): No    Lack of Transportation (Non-Medical): No  Physical Activity:  Not on file  Stress: Not on file  Social Connections: Not on file  Intimate Partner Violence: Not on file  Depression (PHQ2-9): Not on file  Alcohol Screen: Not on file  Housing: Not on file  Utilities: Not on file  Health Literacy: Not on file       Objective:  BP 115/77   Pulse 74   Ht 4' 11 (1.499 m)   Wt 163 lb (73.9 kg)   SpO2 100%   BMI 32.92 kg/m  Wt Readings from Last 3 Encounters:  09/12/24 163 lb (73.9 kg)  05/30/24 160 lb (72.6 kg)  04/23/24 160 lb 8 oz (72.8 kg)   BMI Readings from Last 3 Encounters:  09/12/24 32.92 kg/m  05/30/24 32.32 kg/m  04/23/24 32.42 kg/m   SpO2 Readings from Last 3 Encounters:  09/12/24 100%  05/30/24 100%  03/11/24 100%   Physical Exam General: NAD, alert, WD, WN Eyes: PERRL, no scleral icterus ENMT: oropharynx clear, good dentition, no oral lesions, mallampati score I Skin: warm, intact, no rashes Neck: JVD flat, ROM and lymph node assessment normal CV: RRR, no MRG, nl S1 and S2, no peripheral edema Resp: clear to auscultation bilaterally, no wheezes, rales, or rhonchi, normal effort, no clubbing/cyanosis Neuro: Awake alert oriented to person place time and situation  Diagnostic Review:  Last CBC Lab Results  Component Value Date   WBC 14.4 (H) 12/05/2014   HGB 11.8 (L) 12/05/2014   HCT 36.8 12/05/2014   MCV 93 12/05/2014   MCH 30.0 12/05/2014   RDW 13.8 12/05/2014   PLT 386 12/05/2014   Last metabolic panel Lab Results  Component Value Date   GLUCOSE 131 (H) 12/05/2014   NA 140 12/05/2014   K 3.6 12/05/2014   CL 106 12/05/2014   CO2 25 12/05/2014   BUN 14 12/05/2014   CREATININE 0.79 12/05/2014   GFRNONAA >60 12/05/2014   CALCIUM 8.9 12/05/2014   PROT 7.6 12/05/2014   ALBUMIN 4.2 12/05/2014   BILITOT 0.3 12/05/2014   ALKPHOS 59 12/05/2014   AST 23 12/05/2014   ALT 22 12/05/2014   ANIONGAP 9 12/05/2014    CXR 02/2024 without acute pathology or any infiltrates. Nodular opacity is gone. CXR 01/2024,  right upper lobe nodular opacity    Assessment & Plan:   Assessment & Plan Chronic cough Chronic cough Persisting since pneumonia last year with clear, bubbly phlegm, primarily nocturnal, triggered by heat, spicy foods, and strong perfumes. Partial relief from prednisone  and cough suppressant. Differential includes asthma and GERD. - Ordered chest x-ray to rule out residual lung pathology. - Ordered pulmonary function test to assess for asthma and bronchodilator response. - Advised use of saline nasal spray to reduce post-nasal drip. - Discussed lifestyle modifications to reduce reflux symptoms, including avoiding spicy foods, citrus, and caffeine, and eating 2-3 hours before bedtime. Gastroesophageal reflux disease without esophagitis Suspected gastroesophageal reflux disease Suspected GERD due to cough with spicy foods and bubbly phlegm, suggestive of silent reflux. Symptoms align with reflux despite no heartburn. -  Recommended famotidine 20 mg at night to manage reflux symptoms. - Advised lifestyle modifications to reduce reflux, including dietary changes and timing of meals.  Orders Placed This Encounter  Procedures   DG Chest 2 View   Pulmonary function test   I spent 30 minutes reviewing patient's chart including prior consultant notes, imaging, and PFTs as well as face-to-face with the patient, over half in discussion of the diagnosis and the importance of compliance with the treatment plan.  Return in about 1 month (around 10/13/2024).   Naveed Humphres, MD     [1] No current outpatient medications on file.  "

## 2024-10-17 ENCOUNTER — Ambulatory Visit (INDEPENDENT_AMBULATORY_CARE_PROVIDER_SITE_OTHER): Payer: Self-pay

## 2024-10-17 ENCOUNTER — Ambulatory Visit (INDEPENDENT_AMBULATORY_CARE_PROVIDER_SITE_OTHER): Payer: Self-pay | Admitting: Pulmonary Disease

## 2024-10-17 ENCOUNTER — Encounter (HOSPITAL_BASED_OUTPATIENT_CLINIC_OR_DEPARTMENT_OTHER): Payer: Self-pay | Admitting: Pulmonary Disease

## 2024-10-17 DIAGNOSIS — K219 Gastro-esophageal reflux disease without esophagitis: Secondary | ICD-10-CM | POA: Diagnosis not present

## 2024-10-17 DIAGNOSIS — J381 Polyp of vocal cord and larynx: Secondary | ICD-10-CM | POA: Diagnosis not present

## 2024-10-17 DIAGNOSIS — R053 Chronic cough: Secondary | ICD-10-CM | POA: Diagnosis not present

## 2024-10-17 LAB — PULMONARY FUNCTION TEST
DL/VA % pred: 113 %
DL/VA: 5.04 ml/min/mmHg/L
DLCO unc % pred: 92 %
DLCO unc: 16 ml/min/mmHg
FEF 25-75 Post: 2.36 L/s
FEF 25-75 Pre: 2.93 L/s
FEF2575-%Change-Post: -19 %
FEF2575-%Pred-Post: 103 %
FEF2575-%Pred-Pre: 128 %
FEV1-%Change-Post: -6 %
FEV1-%Pred-Post: 92 %
FEV1-%Pred-Pre: 98 %
FEV1-Post: 2.06 L
FEV1-Pre: 2.2 L
FEV1FVC-%Change-Post: 0 %
FEV1FVC-%Pred-Pre: 111 %
FEV6-%Change-Post: -6 %
FEV6-%Pred-Post: 84 %
FEV6-%Pred-Pre: 90 %
FEV6-Post: 2.32 L
FEV6-Pre: 2.48 L
FEV6FVC-%Pred-Post: 103 %
FEV6FVC-%Pred-Pre: 103 %
FVC-%Change-Post: -6 %
FVC-%Pred-Post: 81 %
FVC-%Pred-Pre: 87 %
FVC-Post: 2.32 L
FVC-Pre: 2.48 L
Post FEV1/FVC ratio: 89 %
Post FEV6/FVC ratio: 100 %
Pre FEV1/FVC ratio: 89 %
Pre FEV6/FVC Ratio: 100 %
RV % pred: 87 %
RV: 1.43 L
TLC % pred: 93 %
TLC: 4.01 L

## 2024-10-17 NOTE — Patient Instructions (Signed)
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 We reviewed your current medications and management strategies, and you have a follow-up appointment with the ENT specialist on March 3rd.  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 Your pulmonary function tests were normal, and albuterol  was not beneficial. -Continue taking omeprazole for reflux management. -Use hydrocodone cough syrup at night to suppress your cough. -Use over-the-counter cough syrup with honey during the day as needed. -Consider dietary modifications to reduce reflux symptoms, such as avoiding spicy foods and maintaining an upright position after eating.  GASTROESOPHAGEAL REFLUX DISEASE: Your acid reflux has been confirmed by the ENT specialist and is contributing to your cough and reflux bubbles. -Continue taking omeprazole as prescribed by the ENT specialist. -Implement lifestyle modifications to manage reflux, including dietary changes and elevating the head during sleep.   Contains text generated by [employer].   "

## 2024-10-17 NOTE — Progress Notes (Signed)
 "  Established Patient Pulmonology Office Visit   Subjective:  Patient ID: Maria Jacobs, female    DOB: [birth year]  MRN: [id number]  CC:  Chief Complaint  Patient presents with   Cough    HPI  Discussed the use of AI scribe software for clinical note transcription with the patient, who gave verbal consent to proceed.  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  She has a persistent cough, which she attributes to her acid reflux and vocal cord polyp. She was initially prescribed famotidine, but an ENT specialist recommended switching to omeprazole, which she takes once daily and finds effective.  An ENT specialist identified a polyp on her vocal cord, which may be contributing to her cough. She has a follow-up appointment scheduled with the ENT on March 3rd to discuss further steps.  To manage her cough, she uses hydrocodone cough syrup at night, which effectively reduces her nighttime coughing. During the day, she uses an over-the-counter cough syrup with honey, which she finds effective despite its unpleasant taste.  She underwent a breathing test. She had a previous chest x-ray in [place].  She drinks warm water at night and in the morning, which she finds helpful.  ROS   Current Medications[1]      Objective:  There were no vitals taken for this visit. Wt Readings from Last 3 Encounters:  09/12/24 163 lb (73.9 kg)  05/30/24 160 lb (72.6 kg)  04/23/24 160 lb 8 oz (72.8 kg)   BMI Readings from Last 3 Encounters:  09/12/24 32.92 kg/m  05/30/24 32.32 kg/m  04/23/24 32.42 kg/m   SpO2 Readings from Last 3 Encounters:  09/12/24 100%  05/30/24 100%  03/11/24 100%    Physical Exam General: NAD, alert, WD, WN Eyes: PERRL, no scleral icterus ENMT: oropharynx clear, good dentition, no oral lesions, mallampati score  Skin: warm, intact, no rashes Neck: JVD, ROM and lymph  node assessment normal LN: axillary, supra-clavicular, and inguinal lymph node assessment CV: RRR, no MRG, nl S1 and S2, no peripheral edema Resp: clear to auscultation bilaterally, no wheezes, rales, or rhonchi, normal effort, no clubbing/cyanosis Neuro: Awake alert oriented to person place time and situation  Diagnostic Review:  Last CBC Lab Results  Component Value Date   WBC 14.4 (H) 12/05/2014   HGB 11.8 (L) 12/05/2014   HCT 36.8 12/05/2014   MCV 93 12/05/2014   MCH 30.0 12/05/2014   RDW 13.8 12/05/2014   PLT 386 12/05/2014   Last metabolic panel Lab Results  Component Value Date   GLUCOSE 131 (H) 12/05/2014   NA 140 12/05/2014   K 3.6 12/05/2014   CL 106 12/05/2014   CO2 25 12/05/2014   BUN 14 12/05/2014   CREATININE 0.79 12/05/2014   GFRNONAA >60 12/05/2014   CALCIUM 8.9 12/05/2014   PROT 7.6 12/05/2014   ALBUMIN 4.2 12/05/2014   BILITOT 0.3 12/05/2014   ALKPHOS 59 12/05/2014   AST 23 12/05/2014   ALT 22 12/05/2014   ANIONGAP 9 12/05/2014   CXR 02/2024 without acute pathology or any infiltrates. Nodular opacity is gone. 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 Normal pulmonary function tests. Albuterol  decreased FEV1 and FVC, indicating it is not beneficial. Cough more pronounced at  night, possibly related to reflux and polyp. - Continue omeprazole for reflux management. - Use hydrocodone cough syrup at night for cough suppression. - Use over-the-counter cough syrup with honey during the day as needed. - Follow up with ENT for evaluation and management of vocal cord polyp. - Consider dietary modifications to reduce reflux symptoms, such as avoiding spicy foods and maintaining an upright position after eating. Gastroesophageal reflux disease without esophagitis Gastroesophageal reflux  disease Confirmed by ENT with symptoms of coughing and reflux bubbles. Omeprazole more effective than famotidine. Reflux symptoms exacerbated at night, contributing to chronic cough. - Continue omeprazole as prescribed by ENT. - Implement lifestyle modifications to manage reflux, including dietary changes and elevating the head during sleep. 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  No orders of the defined types were placed in this encounter.  I spent 20 minutes reviewing patient's chart including prior consultant notes, imaging, and PFTs as well as face-to-face with the patient, over half in discussion of the diagnosis and the importance of compliance with the treatment plan.  Return in about 1 year (around 10/17/2025).   Sneha Willig, MD     [1] No current outpatient medications on file.  "

## 2024-10-17 NOTE — Patient Instructions (Signed)
 Full PFT performed today.

## 2024-10-17 NOTE — Progress Notes (Signed)
 Full PFT performed today.
